# Patient Record
Sex: Male | Born: 1940 | Race: White | Hispanic: No | Smoking: Current every day smoker
Health system: Southern US, Community
[De-identification: ages and names within clinical notes are randomized; demographics above are authoritative.]

## PROBLEM LIST (undated history)

## (undated) DIAGNOSIS — Z87442 Personal history of urinary calculi: Secondary | ICD-10-CM

## (undated) DIAGNOSIS — T7840XA Allergy, unspecified, initial encounter: Secondary | ICD-10-CM

## (undated) DIAGNOSIS — J45909 Unspecified asthma, uncomplicated: Secondary | ICD-10-CM

## (undated) DIAGNOSIS — I350 Nonrheumatic aortic (valve) stenosis: Secondary | ICD-10-CM

## (undated) DIAGNOSIS — G709 Myoneural disorder, unspecified: Secondary | ICD-10-CM

## (undated) DIAGNOSIS — C801 Malignant (primary) neoplasm, unspecified: Secondary | ICD-10-CM

## (undated) DIAGNOSIS — I10 Essential (primary) hypertension: Secondary | ICD-10-CM

## (undated) DIAGNOSIS — N029 Recurrent and persistent hematuria with unspecified morphologic changes: Secondary | ICD-10-CM

## (undated) DIAGNOSIS — R001 Bradycardia, unspecified: Secondary | ICD-10-CM

## (undated) DIAGNOSIS — R972 Elevated prostate specific antigen [PSA]: Secondary | ICD-10-CM

## (undated) HISTORY — DX: Malignant (primary) neoplasm, unspecified: C80.1

## (undated) HISTORY — PX: CHOLECYSTECTOMY: SHX55

## (undated) HISTORY — PX: INSERTION PROSTATE RADIATION SEED: SUR718

## (undated) HISTORY — DX: Elevated prostate specific antigen (PSA): R97.20

## (undated) HISTORY — DX: Recurrent and persistent hematuria with unspecified morphologic changes: N02.9

## (undated) HISTORY — DX: Allergy, unspecified, initial encounter: T78.40XA

## (undated) HISTORY — PX: SHOULDER SURGERY: SHX246

## (undated) HISTORY — PX: APPENDECTOMY: SHX54

## (undated) HISTORY — DX: Myoneural disorder, unspecified: G70.9

## (undated) HISTORY — PX: PROSTATE BIOPSY: SHX241

---

## 1999-03-18 ENCOUNTER — Ambulatory Visit (HOSPITAL_COMMUNITY): Admission: RE | Admit: 1999-03-18 | Discharge: 1999-03-18 | Payer: Self-pay | Admitting: Internal Medicine

## 1999-05-23 ENCOUNTER — Emergency Department (HOSPITAL_COMMUNITY): Admission: EM | Admit: 1999-05-23 | Discharge: 1999-05-23 | Payer: Self-pay | Admitting: *Deleted

## 2000-03-06 ENCOUNTER — Encounter: Payer: Self-pay | Admitting: Emergency Medicine

## 2000-03-06 ENCOUNTER — Emergency Department (HOSPITAL_COMMUNITY): Admission: EM | Admit: 2000-03-06 | Discharge: 2000-03-06 | Payer: Self-pay | Admitting: Emergency Medicine

## 2000-03-07 ENCOUNTER — Ambulatory Visit (HOSPITAL_COMMUNITY): Admission: RE | Admit: 2000-03-07 | Discharge: 2000-03-08 | Payer: Self-pay

## 2001-04-26 ENCOUNTER — Other Ambulatory Visit: Admission: RE | Admit: 2001-04-26 | Discharge: 2001-04-26 | Payer: Self-pay | Admitting: Urology

## 2003-03-28 ENCOUNTER — Ambulatory Visit (HOSPITAL_COMMUNITY): Admission: RE | Admit: 2003-03-28 | Discharge: 2003-03-28 | Payer: Self-pay | Admitting: Gastroenterology

## 2006-12-11 ENCOUNTER — Encounter: Admission: RE | Admit: 2006-12-11 | Discharge: 2006-12-11 | Payer: Self-pay | Admitting: General Surgery

## 2012-01-29 ENCOUNTER — Ambulatory Visit (INDEPENDENT_AMBULATORY_CARE_PROVIDER_SITE_OTHER): Payer: Medicare Other | Admitting: Emergency Medicine

## 2012-01-29 ENCOUNTER — Ambulatory Visit: Payer: Medicare Other

## 2012-01-29 VITALS — BP 128/68 | HR 69 | Temp 97.9°F | Resp 16 | Ht 69.0 in | Wt 161.0 lb

## 2012-01-29 DIAGNOSIS — S20219A Contusion of unspecified front wall of thorax, initial encounter: Secondary | ICD-10-CM

## 2012-01-29 DIAGNOSIS — S301XXA Contusion of abdominal wall, initial encounter: Secondary | ICD-10-CM

## 2012-01-29 LAB — POCT CBC
Granulocyte percent: 55.8 %G (ref 37–80)
HCT, POC: 42.7 % — AB (ref 43.5–53.7)
Hemoglobin: 14 g/dL — AB (ref 14.1–18.1)
Lymph, poc: 3.5 — AB (ref 0.6–3.4)
MCH, POC: 29.2 pg (ref 27–31.2)
MCHC: 32.8 g/dL (ref 31.8–35.4)
MCV: 89 fL (ref 80–97)
MID (cbc): 0.7 (ref 0–0.9)
MPV: 7.4 fL (ref 0–99.8)
POC Granulocyte: 5.3 (ref 2–6.9)
POC LYMPH PERCENT: 37.1 %L (ref 10–50)
POC MID %: 7.1 %M (ref 0–12)
Platelet Count, POC: 249 10*3/uL (ref 142–424)
RBC: 4.8 M/uL (ref 4.69–6.13)
RDW, POC: 14.2 %
WBC: 9.5 10*3/uL (ref 4.6–10.2)

## 2012-01-29 NOTE — Patient Instructions (Signed)
Chest Contusion You have been checked for injuries to your chest. Your caregiver has not found injuries serious enough to require hospitalization. It is common to have bruises and sore muscles after an injury. These tend to feel worse the first 24 hours. You may gradually develop more stiffness and soreness over the next several hours to several days. This usually feels worse the first morning following your injury. After a few days, you will usually begin to improve. The amount of improvement depends on the amount of damage. Following the accident, if the pain in any area continues to increase or you develop new areas of pain, you should see your primary caregiver or return to the Emergency Department for re-evaluation. HOME CARE INSTRUCTIONS   Put ice on sore areas every 2 hours for 20 minutes while awake for the next 2 days.   Drink extra fluids. Do not drink alcohol.   Activity as tolerated. Lifting may make pain worse.   Only take over-the-counter or prescription medicines for pain, discomfort, or fever as directed by your caregiver. Do not use aspirin. This may increase bruising or increase bleeding.  SEEK IMMEDIATE MEDICAL CARE IF:   There is a worsening of any of the problems that brought you in for care.   Shortness of breath, dizziness or fainting develop.   You have chest pain, difficulty breathing, or develop pain going down the left arm or up into jaw.   You feel sick to your stomach (nausea), vomiting or sweats.   You have increasing belly (abdominal) discomfort.   There is blood in your urine, stool, or if you vomit blood.   There is pain in either shoulder in an area where a shoulder strap would be.   You have feelings of lightheadedness, or if you should have a fainting episode.   You have numbness, tingling, weakness, or problems with the use of your arms or legs.   Severe headaches not relieved with medications develop.   You have a change in bowel or bladder  control.   There is increasing pain in any areas of the body.  If you feel your symptoms are worsening, and you are not able to see your primary caregiver, return to the Emergency Department immediately. MAKE SURE YOU:   Understand these instructions.   Will watch your condition.   Will get help right away if you are not doing well or get worse.  Document Released: 08/02/2001 Document Revised: 10/27/2011 Document Reviewed: 06/25/2008 Exit[employer] Patient Information 2012 [employer]. 

## 2012-01-29 NOTE — Progress Notes (Signed)
  Subjective:    Patient ID: JA OHMAN, male    DOB: [birth year], 71 y.o.   MRN: [id number]  HPI patient was outside running until her when the patellar became stuck on some hard ground. He subsequently had the handle of the tiller striking him in the middle of the chest wall. He now complains of severe pain over the sternum lower chest upper abdomen. He denies being short of breath. He has noted some swelling and discoloration to the skin overlying his lower sternum.    Review of Systems noncontributory as related to the present illness the     Objective:   Physical Exam  Constitutional: He appears well-developed and well-nourished.  HENT:  Head: Normocephalic.  Neck: No JVD present. No tracheal deviation present. No thyromegaly present.  Cardiovascular: Normal rate, normal heart sounds and intact distal pulses.  Exam reveals no gallop and no friction rub.   No murmur heard. Lymphadenopathy:    He has no cervical adenopathy.   There is an area of ecchymoses overlying the xiphoid process. There is also swelling in this area with marked tenderness more on the left than on the right where the cartilage tissue attaches to the lower portion of the sternum  EKG reveals right bundle branch block with one PAC noted. Compared to previous film there is no change [facility] reading (PRIMARY) by  Dr.Josephyne Tarter views of the sternum and chest did not reveal an acute fracture. There is no evidence of pneumothorax on those films. Pulse ox check was 96    Assessment & Plan:  Assessment is chest wall contusion. Exam was said that there is no signs of gross injury in the chest wall. Check films to be sure everything is okay .

## 2012-11-25 ENCOUNTER — Emergency Department (HOSPITAL_COMMUNITY)
Admission: EM | Admit: 2012-11-25 | Discharge: 2012-11-25 | Disposition: A | Discharge status: Home / Self Care | Payer: Medicare Other | Attending: Emergency Medicine | Admitting: Emergency Medicine

## 2012-11-25 ENCOUNTER — Encounter (HOSPITAL_COMMUNITY): Payer: Self-pay

## 2012-11-25 DIAGNOSIS — M549 Dorsalgia, unspecified: Secondary | ICD-10-CM

## 2012-11-25 DIAGNOSIS — M545 Low back pain, unspecified: Secondary | ICD-10-CM | POA: Insufficient documentation

## 2012-11-25 DIAGNOSIS — F17200 Nicotine dependence, unspecified, uncomplicated: Secondary | ICD-10-CM | POA: Insufficient documentation

## 2012-11-25 MED ORDER — HYDROCODONE-ACETAMINOPHEN 5-325 MG PO TABS: 1.0000 | ORAL_TABLET | Freq: Four times a day (QID) | ORAL | Status: DC | PRN

## 2012-11-25 MED ORDER — HYDROCODONE-ACETAMINOPHEN 5-325 MG PO TABS
1.0000 | ORAL_TABLET | Freq: Once | ORAL | Status: AC
  Administered 2012-11-25: 1 via ORAL
  Filled 2012-11-25: qty 1

## 2012-11-25 MED ORDER — DIAZEPAM 5 MG PO TABS
5.0000 mg | ORAL_TABLET | Freq: Once | ORAL | Status: AC
  Administered 2012-11-25: 5 mg via ORAL
  Filled 2012-11-25: qty 1

## 2012-11-25 MED ORDER — DIAZEPAM 5 MG PO TABS: 5.0000 mg | ORAL_TABLET | Freq: Three times a day (TID) | ORAL | Status: DC | PRN

## 2012-11-25 NOTE — ED Provider Notes (Signed)
History     CSN: [id number]  Arrival date & time 11/25/12  1451   First MD Initiated Contact with Patient 11/25/12 1542      Chief Complaint  Patient presents with  . Back Pain    (Consider location/radiation/quality/duration/timing/severity/associated sxs/prior treatment) Patient is a 72 y.o. male presenting with back pain. The history is provided by the patient.  Back Pain  This is a new problem. The current episode started yesterday. The problem occurs constantly. The problem has been gradually worsening. Pertinent negatives include no fever, no numbness, no abdominal pain and no dysuria. 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 Pain is worse with movement, better with rest. No abdominal pain, N, V. No urinary symptoms, numbness or lower extremity weakness. .    No past medical history on file.  Past Surgical History  Procedure Date  . Appendectomy   . Insertion prostate radiation seed   . Shoulder surgery   . Cholecystectomy     No family history on file.  History  Substance Use Topics  . Smoking status: Current Every Day Smoker    Types: Pipe  . Smokeless tobacco: Never Used  . Alcohol Use: No      Review of Systems  Constitutional: Negative for fever and chills.  Gastrointestinal: Negative.  Negative for nausea, vomiting and abdominal pain.  Genitourinary: Negative for dysuria and flank pain.  Musculoskeletal: Positive for back pain.       See HPI  Neurological: Negative.  Negative for numbness.    Allergies  Review of patient's allergies indicates no known allergies.  Home Medications   Current Outpatient Rx  Name  Route  Sig  Dispense  Refill  . IBUPROFEN 200 MG PO TABS   Oral   Take 200 mg by mouth every 6 (six) hours as needed. Pain           BP 118/72  Pulse 60  Temp 97.8 F (36.6 C) (Oral)  Resp 16  Ht 5\' 10"  (1.778 m)  Wt 167 lb (75.751 kg)  BMI 23.96 kg/m2  SpO2  95%  Physical Exam  Constitutional: He is oriented to person, place, and time. He appears well-developed and well-nourished. No distress.  Neck: Normal range of motion.  Cardiovascular: Regular rhythm.   No murmur heard. Pulmonary/Chest: Effort normal. He has no wheezes. He has no rales.  Abdominal: Soft. Bowel sounds are normal. There is no tenderness.       Abdomen is completely non-tender. No pulsatile mass.   Musculoskeletal: Normal range of motion. He exhibits no edema.       Paralumbar tenderness that is mild, worse on the left. No palpable spasm. No CVA tenderness.   Neurological: He is alert and oriented to person, place, and time.  Skin: Skin is warm and dry.  Psychiatric: He has a normal mood and affect.    ED Course  Procedures (including critical care time)  Labs Reviewed - No data to display No results found.   No diagnosis found.  1. Muscular back pain  MDM  Patient medicated and reports improvement in pain. Ambulatory, steady, subjective improvement in ability to walk.         Arnoldo Hooker, PA-C 11/25/12 1741

## 2012-11-25 NOTE — ED Notes (Signed)
[id number]<AV> Expected date:<BR> Expected time:<BR> Means of arrival:<BR> Comments:<BR> closed

## 2012-11-25 NOTE — ED Notes (Signed)
FAMILY WOULD LIKE TO BE NOTIFIED UPON DISCHARGE OR ADMISSION, PLEASE CALL PEYTON HUDSON @ [phone]

## 2012-11-25 NOTE — ED Provider Notes (Signed)
Medical screening examination/treatment/procedure(s) were performed by non-physician practitioner and as supervising physician I was immediately available for consultation/collaboration.   Dominic Butler. Lexus Barletta, MD 11/25/12 1745

## 2012-11-25 NOTE — ED Notes (Signed)
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 Patient is unable to sit.

## 2013-07-05 ENCOUNTER — Ambulatory Visit (INDEPENDENT_AMBULATORY_CARE_PROVIDER_SITE_OTHER): Payer: Medicare Other | Admitting: Family Medicine

## 2013-07-05 ENCOUNTER — Ambulatory Visit (HOSPITAL_COMMUNITY)
Admission: RE | Admit: 2013-07-05 | Discharge: 2013-07-05 | Disposition: A | Discharge status: Home / Self Care | Payer: Medicare Other | Source: Ambulatory Visit | Attending: Family Medicine | Admitting: Family Medicine

## 2013-07-05 ENCOUNTER — Telehealth: Payer: Self-pay | Admitting: Family Medicine

## 2013-07-05 VITALS — BP 122/62 | HR 57 | Temp 97.6°F | Resp 18

## 2013-07-05 DIAGNOSIS — R6889 Other general symptoms and signs: Secondary | ICD-10-CM

## 2013-07-05 DIAGNOSIS — R899 Unspecified abnormal finding in specimens from other organs, systems and tissues: Secondary | ICD-10-CM

## 2013-07-05 DIAGNOSIS — R200 Anesthesia of skin: Secondary | ICD-10-CM

## 2013-07-05 DIAGNOSIS — I6789 Other cerebrovascular disease: Secondary | ICD-10-CM | POA: Insufficient documentation

## 2013-07-05 DIAGNOSIS — R7989 Other specified abnormal findings of blood chemistry: Secondary | ICD-10-CM

## 2013-07-05 DIAGNOSIS — J3489 Other specified disorders of nose and nasal sinuses: Secondary | ICD-10-CM | POA: Insufficient documentation

## 2013-07-05 DIAGNOSIS — R209 Unspecified disturbances of skin sensation: Secondary | ICD-10-CM

## 2013-07-05 DIAGNOSIS — R202 Paresthesia of skin: Secondary | ICD-10-CM

## 2013-07-05 DIAGNOSIS — R799 Abnormal finding of blood chemistry, unspecified: Secondary | ICD-10-CM

## 2013-07-05 LAB — POCT CBC
Granulocyte percent: 44.4 % (ref 37–80)
HCT, POC: 46.3 % (ref 43.5–53.7)
Hemoglobin: 15 g/dL (ref 14.1–18.1)
Lymph, poc: 4.2 — AB (ref 0.6–3.4)
MCH, POC: 29.9 pg (ref 27–31.2)
MCHC: 32.4 g/dL (ref 31.8–35.4)
MCV: 92.4 fL (ref 80–97)
MID (cbc): 0.6 (ref 0–0.9)
MPV: 7.3 fL (ref 0–99.8)
POC Granulocyte: 3.9 (ref 2–6.9)
POC LYMPH PERCENT: 48.4 % (ref 10–50)
POC MID %: 7.2 %M (ref 0–12)
Platelet Count, POC: 263 10*3/uL (ref 142–424)
RBC: 5.01 M/uL (ref 4.69–6.13)
RDW, POC: 14.3 %
WBC: 8.7 10*3/uL (ref 4.6–10.2)

## 2013-07-05 LAB — COMPREHENSIVE METABOLIC PANEL
Alkaline Phosphatase: 59 U/L (ref 39–117)
CO2: 28 mEq/L (ref 19–32)
Creat: 1.08 mg/dL (ref 0.50–1.35)
Glucose, Bld: 97 mg/dL (ref 70–99)
Total Bilirubin: 0.4 mg/dL (ref 0.3–1.2)

## 2013-07-05 LAB — LIPID PANEL
Cholesterol: 157 mg/dL (ref 0–200)
HDL: 54 mg/dL (ref >39)
LDL Cholesterol: 88 mg/dL (ref 0–99)
Total CHOL/HDL Ratio: 2.9 Ratio
Triglycerides: 77 mg/dL (ref <150)
VLDL: 15 mg/dL (ref 0–40)

## 2013-07-05 LAB — COMPREHENSIVE METABOLIC PANEL WITH GFR
ALT: 8 U/L (ref 0–53)
AST: 12 U/L (ref 0–37)
Albumin: 4.1 g/dL (ref 3.5–5.2)
BUN: 16 mg/dL (ref 6–23)
Calcium: 9 mg/dL (ref 8.4–10.5)
Chloride: 105 meq/L (ref 96–112)
Potassium: 4.2 meq/L (ref 3.5–5.3)
Sodium: 139 meq/L (ref 135–145)
Total Protein: 5.9 g/dL — ABNORMAL LOW (ref 6.0–8.3)

## 2013-07-05 LAB — POCT GLYCOSYLATED HEMOGLOBIN (HGB A1C): Hemoglobin A1C: 5.1

## 2013-07-05 NOTE — Progress Notes (Signed)
[facility]:  Office Visit  Chief Complaint:  Chief Complaint  Patient presents with  . Numbness    left leg and arm    HPI: Dominic Butler is a 72 y.o. male who complains of  Left hand numbness this AM, after started after flicking his hand, now he has numbness in his arm and also in his left leg. He has some numbness on the left side of his face x seconds and now gone. He denies weakness or slurred speech.  Denies HTN, DM, XOL but not sure Does not take ASA  He is a smoker, smokes pipe x 60 years He denies any neck problems He denies any heart disease for him Family history of heart disease MI/Stroke No neck problems.  He has had left shoulder surgery but he has not had any problems with   No past medical history on file. Past Surgical History  Procedure Laterality Date  . Appendectomy    . Insertion prostate radiation seed    . Shoulder surgery    . Cholecystectomy     History   Social History  . Marital Status: Divorced    Spouse Name: [name]    Number of Children: [name]  . Years of Education: [name]   Social History Main Topics  . Smoking status: Current Every Day Smoker    Types: Pipe  . Smokeless tobacco: Never Used  . Alcohol Use: No  . Drug Use: No  . Sexual Activity: None   Other Topics Concern  . None   Social History Narrative  . None   No family history on file. No Known Allergies Prior to Admission medications   Medication Sig Start Date End Date Taking? Authorizing Provider  diazepam (VALIUM) 5 MG tablet Take 1 tablet (5 mg total) by mouth every 8 (eight) hours as needed for anxiety. 11/25/12   Shari A Upstill, PA-C  HYDROcodone-acetaminophen (NORCO/VICODIN) 5-325 MG per tablet Take 1 tablet by mouth every 6 (six) hours as needed for pain. 11/25/12   Shari A Upstill, PA-C  ibuprofen (ADVIL,MOTRIN) 200 MG tablet Take 200 mg by mouth every 6 (six) hours as needed. Pain    Historical Provider, MD     ROS: The patient denies fevers,  chills, night sweats, unintentional weight loss, chest pain, palpitations, wheezing, dyspnea on exertion, nausea, vomiting, abdominal pain, dysuria, hematuria, melena, weakness.  All other systems have been reviewed and were otherwise negative with the exception of those mentioned in the HPI and as above.    PHYSICAL EXAM: Filed Vitals:   07/05/13 0814  BP: 122/62  Pulse: 57  Temp: 97.6 F (36.4 C)  Resp: 18   There were no vitals filed for this visit. There is no weight on file to calculate BMI.  General: Alert, no acute distress HEENT:  Normocephalic, atraumatic, oropharynx patent. EOMI, PERRLA, fundoscopic exam nl Cardiovascular:  Regular rate and rhythm, no rubs murmurs or gallops.  No Carotid bruits, radial pulse intact. No pedal edema. e Respiratory: Clear to auscultation bilaterally.  No wheezes, rales, or rhonchi.  No cyanosis, no use of accessory musculature GI: No organomegaly, abdomen is soft and non-tender, positive bowel sounds.  No masses. Skin: No rashes. Neurologic: Facial musculature symmetric. CN 2-12 grossly nl, however has some numbness on left sided ulanar distrubution of 5th finger Psychiatric: Patient is appropriate throughout our interaction. Lymphatic: No cervical lymphadenopathy Musculoskeletal: Gait intact. Neck-normal exam, neg spulring Left shoulder-nl Romberg neg  LABS: Results for orders  placed in visit on 07/05/13  POCT CBC      Result Value Range   WBC 8.7  4.6 - 10.2 K/uL   Lymph, poc 4.2 (*) 0.6 - 3.4   POC LYMPH PERCENT 48.4  10 - 50 %L   MID (cbc) 0.6  0 - 0.9   POC MID % 7.2  0 - 12 %M   POC Granulocyte 3.9  2 - 6.9   Granulocyte percent 44.4  37 - 80 %G   RBC 5.01  4.69 - 6.13 M/uL   Hemoglobin 15.0  14.1 - 18.1 g/dL   HCT, POC 16.1  09.6 - 53.7 %   MCV 92.4  80 - 97 fL   MCH, POC 29.9  27 - 31.2 pg   MCHC 32.4  31.8 - 35.4 g/dL   RDW, POC 04.5     Platelet Count, POC 263  142 - 424 K/uL   MPV 7.3  0 - 99.8 fL  POCT GLYCOSYLATED  HEMOGLOBIN (HGB A1C)      Result Value Range   Hemoglobin A1C 5.1       EKG/XRAY:   Primary read interpreted by Dr. Conley Rolls at [facility]. EKG unchanged from 2013, shows RBB  Without ST elevation/depression   ASSESSMENT/PLAN: Encounter Diagnoses  Name Primary?  . Left arm numbness Yes  . Paresthesia of left arm and leg   . Abnormal laboratory test   . Abnormal blood chemistry      Will get stat head CT without contrast Rule CVA He has low risk factors but he has total left side numbness in hand, arm, leg.  Lipid pending No DM, CBC nl Gross sideeffects, risk and benefits, and alternatives of medications d/w patient. Patient is aware that all medications have potential sideeffects and we are unable to predict every sideeffect or drug-drug interaction that may occur. Go to ER prn  Nikie Cid PHUONG, DO 07/05/2013 9:18 AM

## 2013-07-05 NOTE — Telephone Encounter (Signed)
LM about normal CT results

## 2014-01-29 DIAGNOSIS — Z13820 Encounter for screening for osteoporosis: Secondary | ICD-10-CM | POA: Diagnosis not present

## 2014-04-24 ENCOUNTER — Ambulatory Visit
Admission: RE | Admit: 2014-04-24 | Discharge: 2014-04-24 | Disposition: A | Discharge status: Home / Self Care | Payer: Medicare Other | Source: Ambulatory Visit | Attending: Internal Medicine | Admitting: Internal Medicine

## 2014-04-24 ENCOUNTER — Other Ambulatory Visit: Payer: Self-pay | Admitting: Internal Medicine

## 2014-04-24 DIAGNOSIS — R1032 Left lower quadrant pain: Secondary | ICD-10-CM | POA: Diagnosis not present

## 2014-04-24 DIAGNOSIS — H9110 Presbycusis, unspecified ear: Secondary | ICD-10-CM | POA: Diagnosis not present

## 2014-04-24 DIAGNOSIS — R002 Palpitations: Secondary | ICD-10-CM | POA: Diagnosis not present

## 2014-04-24 DIAGNOSIS — Z0000 Encounter for general adult medical examination without abnormal findings: Secondary | ICD-10-CM | POA: Diagnosis not present

## 2014-04-24 DIAGNOSIS — E785 Hyperlipidemia, unspecified: Secondary | ICD-10-CM | POA: Diagnosis not present

## 2014-04-24 DIAGNOSIS — R05 Cough: Secondary | ICD-10-CM

## 2014-04-24 DIAGNOSIS — Z125 Encounter for screening for malignant neoplasm of prostate: Secondary | ICD-10-CM | POA: Diagnosis not present

## 2014-04-24 DIAGNOSIS — R5381 Other malaise: Secondary | ICD-10-CM | POA: Diagnosis not present

## 2014-04-24 DIAGNOSIS — R059 Cough, unspecified: Secondary | ICD-10-CM

## 2014-04-24 DIAGNOSIS — I70219 Atherosclerosis of native arteries of extremities with intermittent claudication, unspecified extremity: Secondary | ICD-10-CM | POA: Diagnosis not present

## 2014-04-24 DIAGNOSIS — E7800 Pure hypercholesterolemia, unspecified: Secondary | ICD-10-CM | POA: Diagnosis not present

## 2014-04-24 DIAGNOSIS — Z1212 Encounter for screening for malignant neoplasm of rectum: Secondary | ICD-10-CM | POA: Diagnosis not present

## 2014-04-24 DIAGNOSIS — R5383 Other fatigue: Secondary | ICD-10-CM | POA: Diagnosis not present

## 2014-04-24 DIAGNOSIS — Z1339 Encounter for screening examination for other mental health and behavioral disorders: Secondary | ICD-10-CM | POA: Diagnosis not present

## 2014-04-24 DIAGNOSIS — H8309 Labyrinthitis, unspecified ear: Secondary | ICD-10-CM | POA: Diagnosis not present

## 2014-04-24 DIAGNOSIS — Z87891 Personal history of nicotine dependence: Secondary | ICD-10-CM | POA: Diagnosis not present

## 2014-04-24 DIAGNOSIS — Z1331 Encounter for screening for depression: Secondary | ICD-10-CM | POA: Diagnosis not present

## 2014-04-24 DIAGNOSIS — Z79899 Other long term (current) drug therapy: Secondary | ICD-10-CM | POA: Diagnosis not present

## 2014-04-24 DIAGNOSIS — Z131 Encounter for screening for diabetes mellitus: Secondary | ICD-10-CM | POA: Diagnosis not present

## 2014-04-24 DIAGNOSIS — Z23 Encounter for immunization: Secondary | ICD-10-CM | POA: Diagnosis not present

## 2014-11-12 DIAGNOSIS — H2513 Age-related nuclear cataract, bilateral: Secondary | ICD-10-CM | POA: Diagnosis not present

## 2014-12-23 ENCOUNTER — Ambulatory Visit (INDEPENDENT_AMBULATORY_CARE_PROVIDER_SITE_OTHER): Payer: Medicare Other | Admitting: Sports Medicine

## 2014-12-23 ENCOUNTER — Encounter: Payer: Self-pay | Admitting: Sports Medicine

## 2014-12-23 VITALS — BP 124/70 | HR 60 | Temp 98.4°F | Ht 70.0 in | Wt 160.0 lb

## 2014-12-23 DIAGNOSIS — R05 Cough: Secondary | ICD-10-CM

## 2014-12-23 DIAGNOSIS — J029 Acute pharyngitis, unspecified: Secondary | ICD-10-CM

## 2014-12-23 DIAGNOSIS — R059 Cough, unspecified: Secondary | ICD-10-CM

## 2014-12-23 MED ORDER — GUAIFENESIN ER 1200 MG PO TB12: 1.0000 | ORAL_TABLET | Freq: Two times a day (BID) | ORAL | Status: DC | PRN

## 2014-12-23 MED ORDER — AZITHROMYCIN 250 MG PO TABS: ORAL_TABLET | ORAL | Status: DC

## 2014-12-23 MED ORDER — FLUTICASONE PROPIONATE 50 MCG/ACT NA SUSP: NASAL | Status: DC

## 2014-12-23 NOTE — Patient Instructions (Signed)
Upper Respiratory Infection, Adult An upper respiratory infection (URI) is also sometimes known as the common cold. The upper respiratory tract includes the nose, sinuses, throat, trachea, and bronchi. Bronchi are the airways leading to the lungs. Most people improve within 1 week, but symptoms can last up to 2 weeks. A residual cough may last even longer.  CAUSES Many different viruses can infect the tissues lining the upper respiratory tract. The tissues become irritated and inflamed and often become very moist. Mucus production is also common. A cold is contagious. You can easily spread the virus to others by oral contact. This includes kissing, sharing a glass, coughing, or sneezing. Touching your mouth or nose and then touching a surface, which is then touched by another person, can also spread the virus. SYMPTOMS  Symptoms typically develop 1 to 3 days after you come in contact with a cold virus. Symptoms vary from person to person. They may include:  Runny nose.  Sneezing.  Nasal congestion.  Sinus irritation.  Sore throat.  Loss of voice (laryngitis).  Cough.  Fatigue.  Muscle aches.  Loss of appetite.  Headache.  Low-grade fever. DIAGNOSIS  You might diagnose your own cold based on familiar symptoms, since most people get a cold 2 to 3 times a year. Your caregiver can confirm this based on your exam. Most importantly, your caregiver can check that your symptoms are not due to another disease such as strep throat, sinusitis, pneumonia, asthma, or epiglottitis. Blood tests, throat tests, and X-rays are not necessary to diagnose a common cold, but they may sometimes be helpful in excluding other more serious diseases. Your caregiver will decide if any further tests are required. RISKS AND COMPLICATIONS  You may be at risk for a more severe case of the common cold if you smoke cigarettes, have chronic heart disease (such as heart failure) or lung disease (such as asthma), or if  you have a weakened immune system. The very young and very old are also at risk for more serious infections. Bacterial sinusitis, middle ear infections, and bacterial pneumonia can complicate the common cold. The common cold can worsen asthma and chronic obstructive pulmonary disease (COPD). Sometimes, these complications can require emergency medical care and may be life-threatening. PREVENTION  The best way to protect against getting a cold is to practice good hygiene. Avoid oral or hand contact with people with cold symptoms. Wash your hands often if contact occurs. There is no clear evidence that vitamin C, vitamin E, echinacea, or exercise reduces the chance of developing a cold. However, it is always recommended to get plenty of rest and practice good nutrition. TREATMENT  Treatment is directed at relieving symptoms. There is no cure. Antibiotics are not effective, because the infection is caused by a virus, not by bacteria. Treatment may include:  Increased fluid intake. Sports drinks offer valuable electrolytes, sugars, and fluids.  Breathing heated mist or steam (vaporizer or shower).  Eating chicken soup or other clear broths, and maintaining good nutrition.  Getting plenty of rest.  Using gargles or lozenges for comfort.  Controlling fevers with ibuprofen or acetaminophen as directed by your caregiver.  Increasing usage of your inhaler if you have asthma. Zinc gel and zinc lozenges, taken in the first 24 hours of the common cold, can shorten the duration and lessen the severity of symptoms. Pain medicines may help with fever, muscle aches, and throat pain. A variety of non-prescription medicines are available to treat congestion and runny nose. Your caregiver   can make recommendations and may suggest nasal or lung inhalers for other symptoms.  HOME CARE INSTRUCTIONS   Only take over-the-counter or prescription medicines for pain, discomfort, or fever as directed by your  caregiver.  Use a warm mist humidifier or inhale steam from a shower to increase air moisture. This may keep secretions moist and make it easier to breathe.  Drink enough water and fluids to keep your urine clear or pale yellow.  Rest as needed.  Return to work when your temperature has returned to normal or as your caregiver advises. You may need to stay home longer to avoid infecting others. You can also use a face mask and careful hand washing to prevent spread of the virus. SEEK MEDICAL CARE IF:   After the first few days, you feel you are getting worse rather than better.  You need your caregiver's advice about medicines to control symptoms.  You develop chills, worsening shortness of breath, or brown or red sputum. These may be signs of pneumonia.  You develop yellow or brown nasal discharge or pain in the face, especially when you bend forward. These may be signs of sinusitis.  You develop a fever, swollen neck glands, pain with swallowing, or white areas in the back of your throat. These may be signs of strep throat. SEEK IMMEDIATE MEDICAL CARE IF:   You have a fever.  You develop severe or persistent headache, ear pain, sinus pain, or chest pain.  You develop wheezing, a prolonged cough, cough up blood, or have a change in your usual mucus (if you have chronic lung disease).  You develop sore muscles or a stiff neck. Document Released: 05/03/2001 Document Revised: 01/30/2012 Document Reviewed: 02/12/2014 Exit[employer] Patient Information 2015 [employer]. This information is not intended to replace advice given to you by your health care provider. Make sure you discuss any questions you have with your health care provider.  

## 2014-12-23 NOTE — Progress Notes (Signed)
   Subjective:    Patient ID: JAVARIAN JAKUBIAK, male    DOB: 1941-07-09, 74 y.o.   MRN: [id number]  HPI Mr. Dewalt is a 74 year-old male who presents with sore throat, cough, and sinus congestion. Onset was 3 days ago, characterized by a mild sore throat with cough. He tried OTC throat lozenges with some relief. He also tried Nyquil, which helps. He says he says the cough is productive of yellow sputum. No hemoptysis. He has rhinitis of clear secretions. He denies any fevers or chills. He is tolerating PO liquids. Denies any significant body aches, fatigue, shortness of breath, wheezing, chest tightness, dizziness, headache, or pain. He is currently retired, as he was formerly a Engineer, structural. His daughter, who works in healthcare in [place], hosted him last week, but he thinks his symptoms had already started. His daughter was ill with similar URI symptoms. He denies any rash. He otherwise denies any international travel.  Social Hx: He is a smoker, smokes pipe x 60 years Family history of heart disease MI/Stroke. PMHx: No HTN or CAD or DM.  Review of Systems 7 point review of systems was performed and was otherwise negative unless noted in the history of present illness.     Objective:   Physical Exam BP 124/70 mmHg  Pulse 60  Temp(Src) 98.4 F (36.9 C) (Oral)  Ht 5\' 10"  (1.778 m)  Wt 160 lb (72.576 kg)  BMI 22.96 kg/m2  SpO2 93% General appearance: alert, cooperative and appears stated age Head: Normocephalic, without obvious abnormality, atraumatic Eyes: conjunctivae/corneas clear. PERRL, EOM's intact. Fundi benign. Ears: normal TM's and external ear canals both ears Nose: mild congestion, turbinates red, swollen Throat: post nasal drip and pharyngeal erythema, no tonsillar exudates, uvula midline Neck: mild anterior cervical adenopathy, no carotid bruit, no JVD, supple, symmetrical, trachea midline and thyroid not enlarged, symmetric, no tenderness/mass/nodules Lungs:  clear to auscultation bilaterally Heart: regular rate and rhythm, S1, S2 normal, no murmur, click, rub or gallop Skin: Skin color, texture, turgor normal. No rashes or lesions Lymph nodes: mild tender, mobile, right sided anterior cervical LAD    Assessment & Plan:  1. Cough 2. Pharyngitis, likely acute URI  -Multiple diagnoses were considered in care of this patient. Symptoms most consistent with acute URI at this point. -Supportive Cares discussed, including warm humidified air, po hydration, warm fluids -Rx flonase nasal inh, mucinex -Rx given for Z-pak to fill only if sx persist beyond 7 days. If acutely worsening despite this, then follow-up sooner. -If noticing increasing cough, fever >102, shortness of breath, wheezing, lethargy, or for any other concerns, then return to the clinic or go to the emergency department. Patient verbalized understanding and agreement.  Dr. Linnell Fulling, DO Sports Medicine Fellow

## 2014-12-24 NOTE — Progress Notes (Signed)
I personally reviewed this document , and agree with A/P . Dr Marin Comment

## 2015-02-20 ENCOUNTER — Ambulatory Visit (INDEPENDENT_AMBULATORY_CARE_PROVIDER_SITE_OTHER): Payer: Medicare Other | Admitting: Family Medicine

## 2015-02-20 ENCOUNTER — Telehealth: Payer: Self-pay | Admitting: *Deleted

## 2015-02-20 ENCOUNTER — Encounter: Payer: Self-pay | Admitting: Family Medicine

## 2015-02-20 VITALS — BP 120/80 | HR 58 | Temp 97.7°F | Resp 16 | Ht 68.5 in | Wt 154.2 lb

## 2015-02-20 DIAGNOSIS — R319 Hematuria, unspecified: Secondary | ICD-10-CM

## 2015-02-20 DIAGNOSIS — Z13220 Encounter for screening for lipoid disorders: Secondary | ICD-10-CM

## 2015-02-20 DIAGNOSIS — F17200 Nicotine dependence, unspecified, uncomplicated: Secondary | ICD-10-CM

## 2015-02-20 DIAGNOSIS — R200 Anesthesia of skin: Secondary | ICD-10-CM | POA: Diagnosis not present

## 2015-02-20 DIAGNOSIS — Z2821 Immunization not carried out because of patient refusal: Secondary | ICD-10-CM | POA: Diagnosis not present

## 2015-02-20 DIAGNOSIS — Z136 Encounter for screening for cardiovascular disorders: Secondary | ICD-10-CM

## 2015-02-20 DIAGNOSIS — Z125 Encounter for screening for malignant neoplasm of prostate: Secondary | ICD-10-CM

## 2015-02-20 DIAGNOSIS — Z0000 Encounter for general adult medical examination without abnormal findings: Secondary | ICD-10-CM | POA: Diagnosis not present

## 2015-02-20 DIAGNOSIS — Z130 Encounter for screening for diseases of the blood and blood-forming organs and certain disorders involving the immune mechanism: Secondary | ICD-10-CM

## 2015-02-20 DIAGNOSIS — I4510 Unspecified right bundle-branch block: Secondary | ICD-10-CM | POA: Diagnosis not present

## 2015-02-20 DIAGNOSIS — R209 Unspecified disturbances of skin sensation: Secondary | ICD-10-CM | POA: Diagnosis not present

## 2015-02-20 DIAGNOSIS — R972 Elevated prostate specific antigen [PSA]: Secondary | ICD-10-CM

## 2015-02-20 DIAGNOSIS — Z720 Tobacco use: Secondary | ICD-10-CM | POA: Diagnosis not present

## 2015-02-20 DIAGNOSIS — Z1211 Encounter for screening for malignant neoplasm of colon: Secondary | ICD-10-CM | POA: Diagnosis not present

## 2015-02-20 DIAGNOSIS — R202 Paresthesia of skin: Secondary | ICD-10-CM

## 2015-02-20 DIAGNOSIS — Z23 Encounter for immunization: Secondary | ICD-10-CM

## 2015-02-20 LAB — LIPID PANEL
Cholesterol: 156 mg/dL (ref 0–200)
HDL: 81 mg/dL (ref >=40)
LDL Cholesterol: 66 mg/dL (ref 0–99)
Total CHOL/HDL Ratio: 1.9 Ratio
Triglycerides: 45 mg/dL (ref <150)
VLDL: 9 mg/dL (ref 0–40)

## 2015-02-20 LAB — COMPLETE METABOLIC PANEL WITHOUT GFR
Alkaline Phosphatase: 57 U/L (ref 39–117)
BUN: 20 mg/dL (ref 6–23)
Creat: 0.99 mg/dL (ref 0.50–1.35)
GFR, Est Non African American: 76 mL/min
Glucose, Bld: 92 mg/dL (ref 70–99)
Total Bilirubin: 0.6 mg/dL (ref 0.2–1.2)
Total Protein: 6.4 g/dL (ref 6.0–8.3)

## 2015-02-20 LAB — POCT URINALYSIS DIPSTICK
Bilirubin, UA: NEGATIVE
Glucose, UA: NEGATIVE
Leukocytes, UA: NEGATIVE
Nitrite, UA: NEGATIVE
Protein, UA: NEGATIVE
Spec Grav, UA: 1.015
Urobilinogen, UA: 0.2
pH, UA: 5

## 2015-02-20 LAB — POCT UA - MICROSCOPIC ONLY
Casts, Ur, LPF, POC: NEGATIVE
Crystals, Ur, HPF, POC: NEGATIVE
Mucus, UA: POSITIVE
Yeast, UA: NEGATIVE

## 2015-02-20 LAB — COMPLETE METABOLIC PANEL WITH GFR
ALT: 13 U/L (ref 0–53)
AST: 19 U/L (ref 0–37)
Albumin: 3.9 g/dL (ref 3.5–5.2)
CO2: 25 mEq/L (ref 19–32)
Calcium: 8.9 mg/dL (ref 8.4–10.5)
Chloride: 104 mEq/L (ref 96–112)
GFR, Est African American: 88 mL/min
Potassium: 4.3 mEq/L (ref 3.5–5.3)
Sodium: 140 mEq/L (ref 135–145)

## 2015-02-20 LAB — CBC
HCT: 41.9 % (ref 39.0–52.0)
Hemoglobin: 14.3 g/dL (ref 13.0–17.0)
MCH: 29.4 pg (ref 26.0–34.0)
MCHC: 34.1 g/dL (ref 30.0–36.0)
MCV: 86.2 fL (ref 78.0–100.0)
MPV: 8.7 fL (ref 8.6–12.4)
Platelets: 261 10*3/uL (ref 150–400)
RBC: 4.86 MIL/uL (ref 4.22–5.81)
RDW: 13.6 % (ref 11.5–15.5)
WBC: 7.6 10*3/uL (ref 4.0–10.5)

## 2015-02-20 LAB — TSH: TSH: 1.976 u[IU]/mL (ref 0.350–4.500)

## 2015-02-20 MED ORDER — ZOSTER VACCINE LIVE 19400 UNT/0.65ML ~~LOC~~ SOLR: 0.6500 mL | Freq: Once | SUBCUTANEOUS | Status: DC

## 2015-02-20 NOTE — Telephone Encounter (Signed)
Refaxed sign authorization to medical records for additional records (consultations/labs), per Dr Marin Comment. Confirmation page received at 12:45 pm.

## 2015-02-20 NOTE — Telephone Encounter (Signed)
Faxed signed authorization form to medical records to get right bundle block results, per Dr Marin Comment. Confirmation page received at 10:06 am.

## 2015-02-20 NOTE — Progress Notes (Signed)
Chief Complaint:  Chief Complaint  Patient presents with  . Annual Exam    HPI: Dominic Butler is a 74 y.o. male who is here for  An annual visit He is up-to-date on his eye exam. Denies glaucoma or cataracts. He is not up-to-date on his colonoscopy, he had one 5-6 years ago and had benign polyps. 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 He was seeing Dominic Butler at [facility] urology at that time. He also has history of chronic hematuria, he again has seen urology for this and has had a full workup and they cannot find the etiology of his hematuria. Rarely benign. This is according to the patient. He smokes a pipe, he keeps it in his mouth as a pacifier, takes about 2-3 puffs and then just keeps it in his mouth. He does not want to quit. He would like vaccines pending recommendations, but is not sure if he's had his pneumonia vaccines already. He wants to make sure if his tetanus is up to date after we check records from Dominic Butler office before getting any vaccines. He knows that he has never had this shingles vaccine and would like that. Denies any depression or anxiety, is completely functional without any memory or cognitive issues. He does his own ADLs. He has something of a advanced directive. He thinks Dominic Butler who is his close friend is his POA  Former PCP: Dominic Butler Urology: [facility]  Past Medical History  Diagnosis Date  . Neuromuscular disorder   . Allergy   . Elevated prostate specific antigen (PSA)     has had seed implants and also seen by [facility]  . 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 Cancer     Prostate cancer status post radiation  seeds   Past Surgical History  Procedure Laterality Date  . Appendectomy    . Insertion prostate radiation seed    . Shoulder surgery    . Cholecystectomy     History   Social History  . Marital Status: Divorced    Spouse Name: [name]  . Number of Children: [name]  . Years of Education: [name]   Social History Main Topics  . Smoking status: Current Every Day Smoker    Types: Pipe  . Smokeless tobacco: Never Used  . Alcohol Use: No  . Drug Use: No  . Sexual Activity: Not on file   Other Topics Concern  . None   Social History Narrative   No family history on file. No Known Allergies Prior to Admission medications   Medication Sig Start Date End Date Taking? Authorizing Provider  azithromycin (ZITHROMAX) 250 MG tablet Take 2 tabs PO x 1 dose, then 1 tab PO QD x 4 days 12/23/14   Dominic C Pick-Jacobs, DO  diazepam (VALIUM) 5 MG tablet Take 1 tablet (5 mg total) by mouth every 8 (eight) hours as needed for anxiety. Patient not taking: Reported on 12/23/2014 11/25/12   Dominic Lange, PA-C  fluticasone [facility]) 50 MCG/ACT nasal spray 2 sprays each nostril once daily until symptoms resolve 12/23/14   Dominic Catchings,  DO  Guaifenesin (MUCINEX MAXIMUM STRENGTH) 1200 MG TB12 Take 1 tablet (1,200 mg total) by mouth every 12 (twelve) hours as needed. 12/23/14   Dominic C Pick-Jacobs, DO  HYDROcodone-acetaminophen (NORCO/VICODIN) 5-325 MG per tablet Take 1 tablet by mouth every 6 (six) hours as needed for pain. Patient not taking: Reported on 12/23/2014 11/25/12   Dominic Lange, PA-C  ibuprofen (ADVIL,MOTRIN) 200 MG tablet Take 200 mg by mouth every 6 (six) hours as needed. Pain    Historical Provider, MD     ROS: The patient denies fevers, chills, night sweats, unintentional weight loss, chest pain, palpitations, wheezing, dyspnea on exertion, nausea, vomiting, abdominal pain, dysuria, hematuria, melena, numbness, weakness, or tingling.   All other systems have been reviewed and were otherwise negative  with the exception of those mentioned in the HPI and as above.    PHYSICAL EXAM: Filed Vitals:   02/20/15 0909  BP: 120/80  Pulse: 58  Temp: 97.7 F (36.5 C)  Resp: 16   Filed Vitals:   02/20/15 0909  Height: 5' 8.5" (1.74 m)  Weight: 154 lb 3.2 oz (69.945 kg)   Body mass index is 23.1 kg/(m^2).  General: Alert, no acute distress HEENT:  Normocephalic, atraumatic, oropharynx patent. EOMI, PERRLA Cardiovascular:  Regular rate and rhythm, no rubs murmurs or gallops.  No Carotid bruits, radial pulse intact. No pedal edema.  Respiratory: Clear to auscultation bilaterally.  No wheezes, rales, or rhonchi.  No cyanosis, no use of accessory musculature GI: No organomegaly, abdomen is soft and non-tender, positive bowel sounds.  No masses. Skin: No rashes. Neurologic: Facial musculature symmetric. Psychiatric: Patient is appropriate throughout our interaction. Lymphatic: No cervical lymphadenopathy Musculoskeletal: Gait intact. Gu- no masses, neg inguinal hernia, prostate normal Circumcised, small left testicle + external hemorrhoids  LABS: Results for orders placed or performed in visit on 02/20/15  POCT UA - Microscopic Only  Result Value Ref Range   WBC, Ur, HPF, POC 0-1    RBC, urine, microscopic 15-21    Bacteria, U Microscopic 2+    Mucus, UA pos    Epithelial cells, urine per micros 0-2    Crystals, Ur, HPF, POC neg    Casts, Ur, LPF, POC neg    Yeast, UA neg   POCT urinalysis dipstick  Result Value Ref Range   Color, UA yellow    Clarity, UA clear    Glucose, UA neg    Bilirubin, UA neg    Ketones, UA trace    Spec Grav, UA 1.015    Blood, UA mod    pH, UA 5.0    Protein, UA neg    Urobilinogen, UA 0.2    Nitrite, UA neg    Leukocytes, UA Negative      EKG/XRAY:   Primary read interpreted by Dr. Marin Comment at [facility]   ASSESSMENT/PLAN: Encounter Diagnoses  Name Primary?  . Annual physical exam Yes  . Screening for hyperlipidemia   . Special screening for  malignant neoplasms, colon   . Screening for deficiency anemia   . Screening for prostate cancer   . Elevated PSA   . Numbness and tingling in left upper extremity   . Right bundle branch block   . Hematuria   . Influenza vaccination declined   . Pneumococcal vaccination declined   . Tobacco use disorder   . Screening for cardiovascular condition   . Need for shingles vaccine    74 year male with PMH of prostate cancer s/p rxt seeding, chronic  benign hematuria, sinus bradycardia with RBBB who is here for an annual visit.  He declines any vaccines except for shingles until we review records.  Labs pending EKG shows no acute changes. He will schedule his own colonoscopy Shingles vaccine rx given We will wait for records from Dr. Mancel Bale office to determine if he needs any other vaccinations at this time. Follow-up in one year.   Gross sideeffects, risk and benefits, and alternatives of medications d/w patient. Patient is aware that all medications have potential sideeffects and we are unable to predict every sideeffect or drug-drug interaction that may occur.  LE, [place], DO 02/20/2015 1:37 PM

## 2015-02-21 ENCOUNTER — Encounter: Payer: Self-pay | Admitting: Family Medicine

## 2015-02-21 LAB — PSA: PSA: 0.05 ng/mL (ref <=4.00)

## 2015-06-24 ENCOUNTER — Ambulatory Visit (INDEPENDENT_AMBULATORY_CARE_PROVIDER_SITE_OTHER): Payer: Medicare Other | Admitting: Family Medicine

## 2015-06-24 VITALS — BP 120/70 | HR 60 | Temp 97.6°F | Resp 14 | Ht 69.25 in | Wt 159.6 lb

## 2015-06-24 DIAGNOSIS — W57XXXA Bitten or stung by nonvenomous insect and other nonvenomous arthropods, initial encounter: Secondary | ICD-10-CM

## 2015-06-24 DIAGNOSIS — R21 Rash and other nonspecific skin eruption: Secondary | ICD-10-CM

## 2015-06-24 DIAGNOSIS — T148 Other injury of unspecified body region: Secondary | ICD-10-CM

## 2015-06-24 MED ORDER — DOXYCYCLINE HYCLATE 100 MG PO TABS
100.0000 mg | ORAL_TABLET | Freq: Two times a day (BID) | ORAL | Status: DC
Start: 2015-06-24 — End: 2016-01-30

## 2015-06-24 NOTE — Progress Notes (Signed)
 Chief Complaint:  Chief Complaint  Patient presents with  . Tick Bite    Pt removed tick from his bottom area last Thursday. now has red dots on skin all over, mostly chest  . Sunburn    Top of scalp    HPI: Dominic Butler is a 74 y.o. male who reports to [facility] today complaining of tick bite on the left side of his buttock x 7 days. He got it out with the help of a friend. Not sure if he got the head out.  He has had no fevers or chills but has ahd some rashes that have popped up and are itchy. He is always outside working in wooded areas.   He also wants to know if the area on top of his head is something he should worry about. It itches and he keeps scratching it. He has tried otc hydrocortisone.   Past Medical History  Diagnosis Date  . Neuromuscular disorder   . Allergy   . Elevated prostate specific antigen (PSA)     has had seed implants and also seen by [facility]  . 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 Cancer     Prostate cancer status post radiation seeds   Past Surgical History  Procedure Laterality Date  . Appendectomy    . Insertion prostate radiation seed    . Shoulder surgery    . Cholecystectomy     History   Social History  . Marital Status: Divorced    Spouse Name: [name]  . Number of Children: [name]  . Years of Education: [name]   Social History Main Topics  . Smoking status: Current Every Day Smoker    Types: Pipe  . Smokeless tobacco: Never Used  . Alcohol Use: No  . Drug Use: No  . Sexual Activity: Not on file   Other Topics Concern  . None   Social History Narrative   History reviewed. No pertinent family history. No Known Allergies Prior to Admission medications   Medication Sig Start Date End Date Taking? Authorizing Provider  fluticasone (FLONASE) 50 MCG/ACT nasal spray 2 sprays each nostril once daily until symptoms resolve Patient not taking:  Reported on 06/24/2015 12/23/14   John C Pick-Jacobs, DO  ibuprofen (ADVIL,MOTRIN) 200 MG tablet Take 200 mg by mouth every 6 (six) hours as needed. Pain    Historical Provider, MD  zoster vaccine live, PF, (ZOSTAVAX) 56314 UNT/0.65ML injection Inject 19,400 Units into the skin once. Patient not taking: Reported on 06/24/2015 02/20/15    P , DO     ROS: The patient denies fevers, chills, night sweats, unintentional weight loss, chest pain, palpitations, wheezing, dyspnea on exertion, nausea, vomiting, abdominal pain, dysuria, hematuria, melena, numbness, weakness, or tingling.   All other systems have been reviewed and were otherwise negative with the exception of those mentioned in the HPI and as above.    PHYSICAL EXAM: Filed Vitals:   06/24/15 1856  BP: 120/70  Pulse: 60  Temp: 97.6 F (36.4 C)  Resp: 14   Body mass index is 23.4 kg/(m^2).   General: Alert, no acute distress HEENT:  Normocephalic, atraumatic, oropharynx patent. EOMI, PERRLA Cardiovascular:  Regular rate and rhythm, no rubs murmurs or gallops.  No Carotid bruits, radial pulse intact. No pedal edema.  Respiratory: Clear to auscultation bilaterally.  No wheezes, rales, or rhonchi.  No cyanosis, no use of accessory musculature Abdominal: No organomegaly, abdomen is soft and non-tender, positive bowel sounds. No masses. Skin: + left small abscess and some minimal folliculits, + erythematous but no target lesions, no fluctuance Neurologic: Facial musculature symmetric. Psychiatric: Patient acts appropriately throughout our interaction. Lymphatic: No cervical or submandibular lymphadenopathy Musculoskeletal: Gait intact. No edema, tenderness   LABS: Results for orders placed or performed in visit on 02/20/15  COMPLETE METABOLIC PANEL WITH GFR  Result Value Ref Range   Sodium 140 135 - 145 mEq/L   Potassium 4.3 3.5 - 5.3 mEq/L   Chloride 104 96 - 112 mEq/L   CO2 25 19 - 32 mEq/L   Glucose, Bld 92 70 - 99 mg/dL    BUN 20 6 - 23 mg/dL   Creat 0.99 0.50 - 1.35 mg/dL   Total Bilirubin 0.6 0.2 - 1.2 mg/dL   Alkaline Phosphatase 57 39 - 117 U/L   AST 19 0 - 37 U/L   ALT 13 0 - 53 U/L   Total Protein 6.4 6.0 - 8.3 g/dL   Albumin 3.9 3.5 - 5.2 g/dL   Calcium 8.9 8.4 - 10.5 mg/dL   GFR, Est African American 88 mL/min   GFR, Est Non African American 76 mL/min  CBC  Result Value Ref Range   WBC 7.6 4.0 - 10.5 K/uL   RBC 4.86 4.22 - 5.81 MIL/uL   Hemoglobin 14.3 13.0 - 17.0 g/dL   HCT 41.9 39.0 - 52.0 %   MCV 86.2 78.0 - 100.0 fL   MCH 29.4 26.0 - 34.0 pg   MCHC 34.1 30.0 - 36.0 g/dL   RDW 13.6 11.5 - 15.5 %   Platelets 261 150 - 400 K/uL   MPV 8.7 8.6 - 12.4 fL  TSH  Result Value Ref Range   TSH 1.976 0.350 - 4.500 uIU/mL  PSA  Result Value Ref Range   PSA 0.05 <=4.00 ng/mL  Lipid panel  Result Value Ref Range   Cholesterol 156 0 - 200 mg/dL   Triglycerides 45 <150 mg/dL   HDL 81 >=40 mg/dL   Total CHOL/HDL Ratio 1.9 Ratio   VLDL 9 0 - 40 mg/dL   LDL Cholesterol 66 0 - 99 mg/dL  POCT UA - Microscopic Only  Result Value Ref Range   WBC, Ur, HPF, POC 0-1    RBC, urine, microscopic 15-21    Bacteria, U Microscopic 2+    Mucus, UA pos    Epithelial cells, urine per micros 0-2    Crystals, Ur, HPF, POC neg    Casts, Ur, LPF, POC neg    Yeast, UA neg   POCT urinalysis dipstick  Result Value Ref Range   Color, UA yellow    Clarity, UA clear    Glucose, UA neg    Bilirubin, UA neg    Ketones, UA trace    Spec Grav, UA 1.015    Blood, UA mod    pH, UA 5.0    Protein, UA neg    Urobilinogen, UA 0.2    Nitrite, UA neg    Leukocytes, UA Negative      EKG/XRAY:   Primary read interpreted by Dr. Marin Comment at [facility].   ASSESSMENT/PLAN: Encounter Diagnoses  Name Primary?  . Tick bite Yes  . Rash and nonspecific skin eruption    Monitor scab on head, if no improvement then need to refer to derm for possible biospy, I have advise him to wear a hat and  also to use vasoline on it and otc  hydrocortisone, I cannot tell if is is [facility] or an [place] Rx Doxycycline for tick bite, tick was removed after 72 hours he thinks, the head is intact on the body Fu prn   Gross sideeffects, risk and benefits, and alternatives of medications d/w patient. Patient is aware that all medications have potential sideeffects and we are unable to predict every sideeffect or drug-drug interaction that may occur.    DO  06/25/2015 7:55 AM

## 2015-06-24 NOTE — Patient Instructions (Signed)
Tick Bite Information Ticks are insects that attach themselves to the skin and draw blood for food. There are various types of ticks. Common types include wood ticks and deer ticks. Most ticks live in shrubs and grassy areas. Ticks can climb onto your body when you make contact with leaves or grass where the tick is waiting. The most common places on the body for ticks to attach themselves are the scalp, neck, armpits, waist, and groin. Most tick bites are harmless, but sometimes ticks carry germs that cause diseases. These germs can be spread to a person during the tick's feeding process. The chance of a disease spreading through a tick bite depends on:   The type of tick.  Time of year.   How long the tick is attached.   Geographic location.  HOW CAN YOU PREVENT TICK BITES? Take these steps to help prevent tick bites when you are outdoors:  Wear protective clothing. Long sleeves and long pants are best.   Wear white clothes so you can see ticks more easily.  Tuck your pant legs into your socks.   If walking on a trail, stay in the middle of the trail to avoid brushing against bushes.  Avoid walking through areas with long grass.  Put insect repellent on all exposed skin and along boot tops, pant legs, and sleeve cuffs.   Check clothing, hair, and skin repeatedly and before going inside.   Brush off any ticks that are not attached.  Take a shower or bath as soon as possible after being outdoors.  WHAT IS THE PROPER WAY TO REMOVE A TICK? Ticks should be removed as soon as possible to help prevent diseases caused by tick bites. 1. If latex gloves are available, put them on before trying to remove a tick.  2. Using fine-point tweezers, grasp the tick as close to the skin as possible. You may also use curved forceps or a tick removal tool. Grasp the tick as close to its head as possible. Avoid grasping the tick on its body. 3. Pull gently with steady upward pressure until  the tick lets go. Do not twist the tick or jerk it suddenly. This may break off the tick's head or mouth parts. 4. Do not squeeze or crush the tick's body. This could force disease-carrying fluids from the tick into your body.  5. After the tick is removed, wash the bite area and your hands with soap and water or other disinfectant such as alcohol. 6. Apply a small amount of antiseptic cream or ointment to the bite site.  7. Wash and disinfect any instruments that were used.  Do not try to remove a tick by applying a hot match, petroleum jelly, or fingernail polish to the tick. These methods do not work and may increase the chances of disease being spread from the tick bite.  WHEN SHOULD YOU SEEK MEDICAL CARE? Contact your health care provider if you are unable to remove a tick from your skin or if a part of the tick breaks off and is stuck in the skin.  After a tick bite, you need to be aware of signs and symptoms that could be related to diseases spread by ticks. Contact your health care provider if you develop any of the following in the days or weeks after the tick bite:  Unexplained fever.  Rash. A circular rash that appears days or weeks after the tick bite may indicate the possibility of Lyme disease. The rash may resemble   a target with a bull's-eye and may occur at a different part of your body than the tick bite.  Redness and swelling in the area of the tick bite.   Tender, swollen lymph glands.   Diarrhea.   Weight loss.   Cough.   Fatigue.   Muscle, joint, or bone pain.   Abdominal pain.   Headache.   Lethargy or a change in your level of consciousness.  Difficulty walking or moving your legs.   Numbness in the legs.   Paralysis.  Shortness of breath.   Confusion.   Repeated vomiting.  Document Released: 11/04/2000 Document Revised: 08/28/2013 Document Reviewed: 04/17/2013 Exit[employer] Patient Information 2015 [employer]. This information is  not intended to replace advice given to you by your health care provider. Make sure you discuss any questions you have with your health care provider.  

## 2015-12-06 ENCOUNTER — Ambulatory Visit (INDEPENDENT_AMBULATORY_CARE_PROVIDER_SITE_OTHER): Payer: Medicare Other | Admitting: Emergency Medicine

## 2015-12-06 VITALS — BP 138/80 | HR 62 | Temp 97.6°F | Resp 20 | Ht 69.0 in | Wt 165.4 lb

## 2015-12-06 DIAGNOSIS — L309 Dermatitis, unspecified: Secondary | ICD-10-CM

## 2015-12-06 MED ORDER — TRIAMCINOLONE ACETONIDE 0.1 % EX CREA: 1.0000 "application " | TOPICAL_CREAM | Freq: Three times a day (TID) | CUTANEOUS | Status: DC

## 2015-12-06 NOTE — Patient Instructions (Signed)
Eczema Eczema, also called atopic dermatitis, is a skin disorder that causes inflammation of the skin. It causes a red rash and dry, scaly skin. The skin becomes very itchy. Eczema is generally worse during the cooler winter months and often improves with the warmth of summer. Eczema usually starts showing signs in infancy. Some children outgrow eczema, but it may last through adulthood.  CAUSES  The exact cause of eczema is not known, but it appears to run in families. People with eczema often have a family history of eczema, allergies, asthma, or hay fever. Eczema is not contagious. Flare-ups of the condition may be caused by:   Contact with something you are sensitive or allergic to.   Stress. SIGNS AND SYMPTOMS  Dry, scaly skin.   Red, itchy rash.   Itchiness. This may occur before the skin rash and may be very intense.  DIAGNOSIS  The diagnosis of eczema is usually made based on symptoms and medical history. TREATMENT  Eczema cannot be cured, but symptoms usually can be controlled with treatment and other strategies. A treatment plan might include:  Controlling the itching and scratching.   Use over-the-counter antihistamines as directed for itching. This is especially useful at night when the itching tends to be worse.   Use over-the-counter steroid creams as directed for itching.   Avoid scratching. Scratching makes the rash and itching worse. It may also result in a skin infection (impetigo) due to a break in the skin caused by scratching.   Keeping the skin well moisturized with creams every day. This will seal in moisture and help prevent dryness. Lotions that contain alcohol and water should be avoided because they can dry the skin.   Limiting exposure to things that you are sensitive or allergic to (allergens).   Recognizing situations that cause stress.   Developing a plan to manage stress.  HOME CARE INSTRUCTIONS   Only take over-the-counter or  prescription medicines as directed by your health care provider.   Do not use anything on the skin without checking with your health care provider.   Keep baths or showers short (5 minutes) in warm (not hot) water. Use mild cleansers for bathing. These should be unscented. You may add nonperfumed bath oil to the bath water. It is best to avoid soap and bubble bath.   Immediately after a bath or shower, when the skin is still damp, apply a moisturizing ointment to the entire body. This ointment should be a petroleum ointment. This will seal in moisture and help prevent dryness. The thicker the ointment, the better. These should be unscented.   Keep fingernails cut short. Children with eczema may need to wear soft gloves or mittens at night after applying an ointment.   Dress in clothes made of cotton or cotton blends. Dress lightly, because heat increases itching.   A child with eczema should stay away from anyone with fever blisters or cold sores. The virus that causes fever blisters (herpes simplex) can cause a serious skin infection in children with eczema. SEEK MEDICAL CARE IF:   Your itching interferes with sleep.   Your rash gets worse or is not better within 1 week after starting treatment.   You see pus or soft yellow scabs in the rash area.   You have a fever.   You have a rash flare-up after contact with someone who has fever blisters.    This information is not intended to replace advice given to you by your health care   provider. Make sure you discuss any questions you have with your health care provider.   Document Released: 11/04/2000 Document Revised: 08/28/2013 Document Reviewed: 06/10/2013 Elsevier Interactive Patient Education 2016 [employer]  

## 2015-12-06 NOTE — Progress Notes (Signed)
Subjective:  Patient ID: Dominic Butler, male    DOB: [birth year]  Age: 75 y.o. MRN: [id number]  CC: Rash   HPI Dominic Butler presents  she's noted a rash on the back of his neck in his hairline over the last 2 weeks. He's been scratching it. Has been scratching his worsened. He denies any new allergy exposure other cutaneous rash. Says the rash is painless was never vesicular. He has no fever chills or antecedent injury.  History Dominic Butler has a past medical history of Neuromuscular disorder ([place]); Allergy; Elevated prostate specific antigen (PSA); Benign hematuria; and Cancer ([place]).   He has past surgical history that includes Appendectomy; Insertion prostate radiation seed; Shoulder surgery; and Cholecystectomy.   His  family history is not on file.  He   reports that he has been smoking Pipe.  He has never used smokeless tobacco. He reports that he does not drink alcohol or use illicit drugs.  Outpatient Prescriptions Prior to Visit  Medication Sig Dispense Refill  . doxycycline (VIBRA-TABS) 100 MG tablet Take 1 tablet (100 mg total) by mouth 2 (two) times daily. (Patient not taking: Reported on 12/06/2015) 28 tablet 0  . ibuprofen (ADVIL,MOTRIN) 200 MG tablet Take 200 mg by mouth every 6 (six) hours as needed. Reported on 12/06/2015     No facility-administered medications prior to visit.    Social History   Social History  . Marital Status: Divorced    Spouse Name: [name]  . Number of Children: [name]  . Years of Education: [name]   Social History Main Topics  . Smoking status: Current Every Day Smoker    Types: Pipe  . Smokeless tobacco: Never Used  . Alcohol Use: No  . Drug Use: No  . Sexual Activity: Not Asked   Other Topics Concern  . None   Social History Narrative     Review of Systems  Constitutional: Negative for fever, chills and appetite change.  HENT: Negative for congestion, ear pain, postnasal drip, sinus pressure and sore throat.   Eyes: Negative  for pain and redness.  Respiratory: Negative for cough, shortness of breath and wheezing.   Cardiovascular: Negative for leg swelling.  Gastrointestinal: Negative for nausea, vomiting, abdominal pain, diarrhea, constipation and blood in stool.  Endocrine: Negative for polyuria.  Genitourinary: Negative for dysuria, urgency, frequency and flank pain.  Musculoskeletal: Negative for gait problem.  Skin: Negative for rash.  Neurological: Negative for weakness and headaches.  Psychiatric/Behavioral: Negative for confusion and decreased concentration. The patient is not nervous/anxious.     Objective:  BP 138/80 mmHg  Pulse 62  Temp(Src) 97.6 F (36.4 C) (Oral)  Resp 20  Ht 5\' 9"  (1.753 m)  Wt 165 lb 6.4 oz (75.025 kg)  BMI 24.41 kg/m2  SpO2 97%  Physical Exam  Constitutional: He is oriented to person, place, and time. He appears well-developed and well-nourished. No distress.  HENT:  Head: Normocephalic and atraumatic.  Right Ear: External ear normal.  Left Ear: External ear normal.  Nose: Nose normal.  Eyes: Conjunctivae and EOM are normal. Pupils are equal, round, and reactive to light. No scleral icterus.  Neck: Normal range of motion. Neck supple. No tracheal deviation present.  Cardiovascular: Normal rate, regular rhythm and normal heart sounds.   Pulmonary/Chest: Effort normal. No respiratory distress. He has no wheezes. He has no rales.  Abdominal: He exhibits no mass. There is no tenderness. There is no rebound and no guarding.  Musculoskeletal: He exhibits no  edema.  Lymphadenopathy:    He has no cervical adenopathy.  Neurological: He is alert and oriented to person, place, and time. Coordination normal.  Skin: Skin is warm and dry. Rash noted.  Psychiatric: He has a normal mood and affect. His behavior is normal. Thought content normal.      Assessment & Plan:   Dominic Butler was seen today for rash.  Diagnoses and all orders for this visit:  Eczema  Other orders -      triamcinolone cream (KENALOG) 0.1 %; Apply 1 application topically 3 (three) times daily.  I am having Dominic Butler start on triamcinolone cream. I am also having him maintain his ibuprofen and doxycycline.  Meds ordered this encounter  Medications  . triamcinolone cream (KENALOG) 0.1 %    Sig: Apply 1 application topically 3 (three) times daily.    Dispense:  30 g    Refill:  0    Appropriate red flag conditions were discussed with the patient as well as actions that should be taken.  Patient expressed his understanding.  Follow-up: Return if symptoms worsen or fail to improve.  Roselee Culver, MD

## 2016-01-30 ENCOUNTER — Ambulatory Visit (INDEPENDENT_AMBULATORY_CARE_PROVIDER_SITE_OTHER): Payer: Medicare Other | Admitting: Family Medicine

## 2016-01-30 VITALS — BP 124/80 | HR 69 | Temp 98.9°F | Resp 17 | Ht 69.0 in | Wt 162.0 lb

## 2016-01-30 DIAGNOSIS — J209 Acute bronchitis, unspecified: Secondary | ICD-10-CM | POA: Diagnosis not present

## 2016-01-30 DIAGNOSIS — R05 Cough: Secondary | ICD-10-CM | POA: Diagnosis not present

## 2016-01-30 DIAGNOSIS — R0981 Nasal congestion: Secondary | ICD-10-CM

## 2016-01-30 LAB — POCT INFLUENZA A/B
Influenza A, POC: NEGATIVE
Influenza B, POC: NEGATIVE

## 2016-01-30 MED ORDER — E-Z SPACER DEVI
Status: DC
Start: 2016-01-30 — End: 2016-03-10

## 2016-01-30 MED ORDER — GUAIFENESIN-CODEINE 100-10 MG/5ML PO SOLN: 5.0000 mL | Freq: Four times a day (QID) | ORAL | Status: DC | PRN

## 2016-01-30 MED ORDER — ALBUTEROL SULFATE (2.5 MG/3ML) 0.083% IN NEBU
2.5000 mg | INHALATION_SOLUTION | Freq: Once | RESPIRATORY_TRACT | Status: AC
  Administered 2016-01-30: 2.5 mg via RESPIRATORY_TRACT

## 2016-01-30 MED ORDER — IPRATROPIUM BROMIDE 0.02 % IN SOLN
0.5000 mg | Freq: Once | RESPIRATORY_TRACT | Status: AC
  Administered 2016-01-30: 0.5 mg via RESPIRATORY_TRACT

## 2016-01-30 MED ORDER — AZITHROMYCIN 250 MG PO TABS: ORAL_TABLET | ORAL | Status: DC

## 2016-01-30 MED ORDER — ALBUTEROL SULFATE HFA 108 (90 BASE) MCG/ACT IN AERS: 2.0000 | INHALATION_SPRAY | RESPIRATORY_TRACT | Status: DC | PRN

## 2016-01-30 NOTE — Patient Instructions (Signed)

## 2016-01-30 NOTE — Progress Notes (Signed)
Subjective:    Patient ID: Dominic Butler, male    DOB: 1941-04-13, 75 y.o.   MRN: [id number]  HPI Chief Complaint  Patient presents with  . Shortness of Breath  . Generalized Body Aches  . Nasal Congestion    HPI Comments: Dominic Butler is a 75 y.o. male who presents to the [facility] complaining of body aches that began 2 days ago. He has associated productive cough with white phlegm, chest pressure, rhinorrhea and nasal congestion. He has tried Copywriter, advertising severe cold and nyquil with minimal relief. Pt denies fever, chills, sweats, sore throat, decreased appetite and trouble sleeping. Pt smokes tobacco through a pipe. He did not get flu shot this year. He denies drug allergies.  There are no active problems to display for this patient.  Past Medical History  Diagnosis Date  . Neuromuscular disorder ([place])   . Allergy   . Elevated prostate specific antigen (PSA)     has had seed implants and also seen by [facility]  . 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 Cancer [facility])     Prostate cancer status post radiation seeds   Past Surgical History  Procedure Laterality Date  . Appendectomy    . Insertion prostate radiation seed    . Shoulder surgery    . Cholecystectomy     No Known Allergies Prior to Admission medications   Not on File   Social History   Social History  . Marital Status: Divorced    Spouse Name: [name]  . Number of Children: [name]  . Years of Education: [name]   Occupational History  . Not on file.   Social History Main Topics  . Smoking status: Current Every Day Smoker    Types: Pipe  . Smokeless tobacco: Never Used  . Alcohol Use: No  . Drug Use: No  . Sexual Activity: Not on file   Other Topics Concern  . Not on file   Social History Narrative   Review of Systems  Constitutional: Negative for fever, chills, diaphoresis and appetite  change.  HENT: Positive for congestion and rhinorrhea. Negative for sore throat.   Respiratory: Positive for cough, chest tightness and shortness of breath.   Allergic/Immunologic: Positive for environmental allergies.  Psychiatric/Behavioral: Negative for sleep disturbance.       Objective:   Physical Exam  Constitutional: He is oriented to person, place, and time. He appears well-developed and well-nourished. No distress.  HENT:  Head: Normocephalic and atraumatic.  Right Ear: Tympanic membrane is injected and retracted.  Left Ear: Tympanic membrane is injected and retracted.  Mouth/Throat: Posterior oropharyngeal erythema present.  Nares with erythema  Eyes: Conjunctivae and EOM are normal.  Neck: Neck supple.  Cardiovascular: Normal rate.  Exam reveals distant heart sounds.   Question of a diastolic murmur  Pulmonary/Chest: Effort normal. Decreased breath sounds:  upper lung.  Decreased air movement in upper lungs.   Musculoskeletal: Normal range of motion.  Neurological: He is alert and oriented to person, place, and time.  Skin: Skin is warm and dry.  Psychiatric: He has a normal mood and affect. His behavior is normal.  Nursing note and vitals reviewed.  After neb treatment, pulmonary exam signicantly improved. Good air movement.   Filed Vitals:   01/30/16 1517  BP: 124/80  Pulse: 69  Temp: 98.9 F (37.2 C)  TempSrc: Oral  Resp: 17  Height: 5\' 9"  (1.753 m)  Weight: 162 lb (73.483 kg)  SpO2: 95%    Results for orders placed or performed in visit on 01/30/16  POCT Influenza A/B  Result Value Ref Range   Influenza A, POC Negative Negative   Influenza B, POC Negative Negative    Assessment & Plan:   1. Acute bronchitis, unspecified organism     Orders Placed This Encounter  Procedures  . POCT Influenza A/B    Meds ordered this encounter  Medications  . albuterol (PROVENTIL) (2.5 MG/3ML) 0.083% nebulizer solution 2.5 mg    Sig:   . ipratropium  (ATROVENT) nebulizer solution 0.5 mg    Sig:   . albuterol (PROVENTIL HFA;VENTOLIN HFA) 108 (90 Base) MCG/ACT inhaler    Sig: Inhale 2 puffs into the lungs every 4 (four) hours as needed for wheezing or shortness of breath.    Dispense:  1 Inhaler    Refill:  1    Ok to use whatever albuterol inhaler that insurance covers. Please demonstrate use - new to pt.  Marland Kitchen Spacer/Aero-Holding Chambers (E-Z SPACER) inhaler    Sig: Use as instructed    Dispense:  1 each    Refill:  0    Please dispense any type of spacer that works with the inhaler being given to pt and demonstrate use.  Marland Kitchen guaiFENesin-codeine 100-10 MG/5ML syrup    Sig: Take 5-10 mLs by mouth every 6 (six) hours as needed for cough.    Dispense:  180 mL    Refill:  0  . azithromycin (ZITHROMAX) 250 MG tablet    Sig: Take 2 tabs PO x 1 dose, then 1 tab PO QD x 4 days    Dispense:  6 tablet    Refill:  0    I personally performed the services described in this documentation, which was scribed in my presence. The recorded information has been reviewed and considered, and addended by me as needed.  Delman Cheadle, MD MPH

## 2016-03-10 ENCOUNTER — Telehealth: Payer: Self-pay | Admitting: Family Medicine

## 2016-03-10 ENCOUNTER — Ambulatory Visit (HOSPITAL_COMMUNITY)
Admission: RE | Admit: 2016-03-10 | Discharge: 2016-03-10 | Disposition: A | Discharge status: Home / Self Care | Payer: Medicare Other | Source: Ambulatory Visit | Attending: Vascular Surgery | Admitting: Vascular Surgery

## 2016-03-10 ENCOUNTER — Ambulatory Visit (INDEPENDENT_AMBULATORY_CARE_PROVIDER_SITE_OTHER): Payer: Medicare Other | Admitting: Urgent Care

## 2016-03-10 ENCOUNTER — Telehealth: Payer: Self-pay

## 2016-03-10 VITALS — BP 118/74 | HR 51 | Temp 97.4°F | Resp 18 | Ht 69.0 in | Wt 166.0 lb

## 2016-03-10 DIAGNOSIS — M7989 Other specified soft tissue disorders: Secondary | ICD-10-CM

## 2016-03-10 DIAGNOSIS — Z8546 Personal history of malignant neoplasm of prostate: Secondary | ICD-10-CM | POA: Diagnosis not present

## 2016-03-10 DIAGNOSIS — R350 Frequency of micturition: Secondary | ICD-10-CM

## 2016-03-10 LAB — POCT CBC
Granulocyte percent: 41.4 %G (ref 37–80)
HEMATOCRIT: 41 % — AB (ref 43.5–53.7)
Hemoglobin: 14.9 g/dL (ref 14.1–18.1)
LYMPH, POC: 5.3 — AB (ref 0.6–3.4)
MCH, POC: 31.4 pg — AB (ref 27–31.2)
MCHC: 36.3 g/dL — AB (ref 31.8–35.4)
MCV: 86.5 fL (ref 80–97)
MID (cbc): 0.7 (ref 0–0.9)
MPV: 6.5 fL (ref 0–99.8)
PLATELET COUNT, POC: 233 10*3/uL (ref 142–424)
POC Granulocyte: 4.2 (ref 2–6.9)
POC LYMPH %: 51.6 % — AB (ref 10–50)
POC MID %: 7 %M (ref 0–12)
RBC: 4.74 M/uL (ref 4.69–6.13)
RDW, POC: 13.8 %
WBC: 10.2 10*3/uL (ref 4.6–10.2)

## 2016-03-10 MED ORDER — TAMSULOSIN HCL 0.4 MG PO CAPS: 0.4000 mg | ORAL_CAPSULE | Freq: Every day | ORAL | Status: DC

## 2016-03-10 NOTE — Telephone Encounter (Signed)
Pt was returning back call and also to be informed about his ultrasound results.  Please advise  [phone]

## 2016-03-10 NOTE — Telephone Encounter (Signed)
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 Rosario Adie notified, have them let patient go and he will call shortly.

## 2016-03-10 NOTE — Telephone Encounter (Signed)
Reported U/S results to patient. He is to use APAP for pain and inflammation. Use warm compresses. Ambulate within pain tolerance and rtc in 1-2 weeks if the problem persists.

## 2016-03-10 NOTE — Progress Notes (Signed)
MRN: [id number] DOB: 08-May-1941  Subjective:   Dominic Butler is a 75 y.o. male presenting for chief complaint of Insect Bite and Medication Refill  Leg issue - Reports 4 day history of tenderness and swelling behind his right knee, pain is worse with palpation. He believes he had a bug bite but cannot recall anything in particular. He has used otc cream with minimal relief. Patient is ambulating well. Denies fever, drainage of pus or bleeding, trauma, itching, rash, cough, congestion, n/v, abdominal pain.  Med Refill - Reports Flomax was prescribed several years ago by Dr. Mancel Bale but he never filled it. He had frequent urination s/p treatment of prostate cancer. He is currently in remission and at that time, his urologist recommended treatment with Flomax. He would like a trial for this.  Dominic Butler has a current medication list which includes the following prescription(s): albuterol, azithromycin, guaifenesin-codeine, and e-z spacer. Also has No Known Allergies.  Dominic Butler  has a past medical history of Neuromuscular disorder ([place]); Allergy; Elevated prostate specific antigen (PSA); Benign hematuria; and Cancer ([place]). Also  has past surgical history that includes Appendectomy; Insertion prostate radiation seed; Shoulder surgery; and Cholecystectomy.  Objective:   Vitals: BP 118/74 mmHg  Pulse 51  Temp(Src) 97.4 F (36.3 C) (Oral)  Resp 18  Ht 5\' 9"  (1.753 m)  Wt 166 lb (75.297 kg)  BMI 24.50 kg/m2  SpO2 98%  Physical Exam  Constitutional: He is oriented to person, place, and time. He appears well-developed and well-nourished.  Cardiovascular: Normal rate, regular rhythm and intact distal pulses.  Exam reveals no gallop and no friction rub.   No murmur heard. Pulmonary/Chest: No respiratory distress. He has no wheezes. He has no rales.  Musculoskeletal:       Right knee: He exhibits swelling (over area depicted). He exhibits normal range of motion, no effusion, no ecchymosis, no  deformity, no laceration, no erythema and normal patellar mobility. Tenderness (over area depicted) found.       Legs: Neurological: He is alert and oriented to person, place, and time.  Skin: Skin is warm and dry.   Results for orders placed or performed in visit on 03/10/16 (from the past 24 hour(s))  POCT CBC     Status: Abnormal   Collection Time: 03/10/16  8:51 AM  Result Value Ref Range   WBC 10.2 4.6 - 10.2 K/uL   Lymph, poc 5.3 (A) 0.6 - 3.4   POC LYMPH PERCENT 51.6 (A) 10 - 50 %L   MID (cbc) 0.7 0 - 0.9   POC MID % 7.0 0 - 12 %M   POC Granulocyte 4.2 2 - 6.9   Granulocyte percent 41.4 37 - 80 %G   RBC 4.74 4.69 - 6.13 M/uL   Hemoglobin 14.9 14.1 - 18.1 g/dL   HCT, POC 41.0 (A) 43.5 - 53.7 %   MCV 86.5 80 - 97 fL   MCH, POC 31.4 (A) 27 - 31.2 pg   MCHC 36.3 (A) 31.8 - 35.4 g/dL   RDW, POC 13.8 %   Platelet Count, POC 233 142 - 424 K/uL   MPV 6.5 0 - 99.8 fL   Assessment and Plan :   This case was precepted with Dr. Reginia Forts.   1. Right leg swelling - Unclear etiology, [place] pending.  2. Urinary frequency 3. History of prostate cancer - Counseled patient on use of Flomax. He will start a trial and let me know how he does.  Jaynee Eagles,  PA-C [facility] [phone] 03/10/2016 8:30 AM

## 2016-03-10 NOTE — Patient Instructions (Addendum)
You have an appointment at 2pm at [facility] for your doppler. [place].  Phone [phone]. You are to arrive at 145pm.    IF you received an x-ray today, you will receive an invoice from [facility]. Please contact [facility] at [phone] with questions or concerns regarding your invoice.   IF you received labwork today, you will receive an invoice from [employer]. Please contact [facility] at [phone] with questions or concerns regarding your invoice.   Our billing staff will not be able to assist you with questions regarding bills from these companies.  You will be contacted with the lab results as soon as they are available. The fastest way to get your results is to activate your My Chart account. Instructions are located on the last page of this paperwork. If you have not heard from [place] regarding the results in 2 weeks, please contact this office.

## 2016-03-14 NOTE — Telephone Encounter (Signed)
Looks like Freida Busman spoke to pt already.

## 2016-03-17 NOTE — Telephone Encounter (Signed)
Already discussed results with patient.

## 2016-08-24 DIAGNOSIS — Z23 Encounter for immunization: Secondary | ICD-10-CM | POA: Diagnosis not present

## 2016-09-28 DIAGNOSIS — H2513 Age-related nuclear cataract, bilateral: Secondary | ICD-10-CM | POA: Diagnosis not present

## 2016-09-28 DIAGNOSIS — H43813 Vitreous degeneration, bilateral: Secondary | ICD-10-CM | POA: Diagnosis not present

## 2017-08-09 ENCOUNTER — Ambulatory Visit: Payer: Medicare Other | Admitting: Family Medicine

## 2017-08-09 NOTE — Progress Notes (Deleted)
Patient ID: Dominic Butler, male   DOB: 11/08/1941, 76 y.o.   MRN: [id number] Subjective:    Dominic Butler is a 76 y.o. male who presents for Medicare Annual/Subsequent preventive examination.   Preventive Screening-Counseling & Management  Tobacco History  Smoking Status  . Current Every Day Smoker  . Types: Pipe  Smokeless Tobacco  . Never Used    Problems Prior to Visit 1. EKG with RBBB dating back to 2012.   Current Problems (verified) There are no active problems to display for this patient.   Medications Prior to Visit Current Outpatient Prescriptions on File Prior to Visit  Medication Sig Dispense Refill  . tamsulosin (FLOMAX) 0.4 MG CAPS capsule Take 1 capsule (0.4 mg total) by mouth daily. 30 capsule 3   No current facility-administered medications on file prior to visit.     Current Medications (verified) Current Outpatient Prescriptions  Medication Sig Dispense Refill  . tamsulosin (FLOMAX) 0.4 MG CAPS capsule Take 1 capsule (0.4 mg total) by mouth daily. 30 capsule 3   No current facility-administered medications for this visit.      Allergies (verified) Patient has no known allergies.   PAST HISTORY  Family History No family history on file.  Social History Social History  Substance Use Topics  . Smoking status: Current Every Day Smoker    Types: Pipe  . Smokeless tobacco: Never Used  . Alcohol use No    Are there smokers in your home (other than you)?  {yes/no:20286}  Risk Factors Current exercise habits: {exercise:19826}  Dietary issues discussed: ***   Cardiac risk factors: {risk factors:510}.  Depression Screen (Note: if answer to either of the following is "Yes", a more complete depression screening is indicated)   Q1: Over the past two weeks, have you felt down, depressed or hopeless? {yes/no:20286}  Q2: Over the past two weeks, have you felt little interest or pleasure in doing things? {yes/no:20286}  Have you lost interest  or pleasure in daily life? {yes/no:20286}  Do you often feel hopeless? {yes/no:20286}  Do you cry easily over simple problems? {yes/no:20286}  Activities of Daily Living In your present state of health, do you have any difficulty performing the following activities?:  Driving? {yes/no:20286} Managing money?  {Responses; yes/no (default no):140031::"No"} Feeding yourself? {yes/no:20286} Getting from bed to chair? {yes/no:20286} Climbing a flight of stairs? {yes/no:20286} Preparing food and eating?: {yes/no (default no):140031::"No"} Bathing or showering? {Responses; yes/no (default no):140031::"No"} Getting dressed: {yes/no (default no):140031::"No"} Getting to the toilet? {Responses; yes/no (default no):140031::"No"} Using the toilet:{yes/no (default no):140031::"No"} Moving around from place to place: {yes/no (default no):140031::"No"} In the past year have you fallen or had a near fall?:{yes/no (default no):140031::"No"}   Are you sexually active?  {yes/no:20286}  Do you have more than one partner?  {Responses; yes/no (default no):140031::"No"}  Hearing Difficulties: {yes/no (default no):140031::"No"} Do you often ask people to speak up or repeat themselves? {Responses; yes/no (default no):140031::"No"} Do you experience ringing or noises in your ears? {Responses; yes/no (default no):140031::"No"} Do you have difficulty understanding soft or whispered voices? {Responses; yes/no (default no):140031::"No"}   Do you feel that you have a problem with memory? {yes/no:20286}  Do you often misplace items? {yes/no:20286}  Do you feel safe at home?  {yes/no:20286}  Cognitive Testing  Alert? {yes/no:20286}  Normal Appearance?{yes/no:20286}  Oriented to person? {yes/no:20286}  Place? {yes/no:20286}   Time? {yes/no:20286}  Recall of three objects?  {yes/no:20286}  Can perform simple calculations? {yes/no:20286}  Displays appropriate judgment?{yes/no:20286}  Can read the correct  time from  a watch face?{yes/no:20286}   Advanced Directives have been discussed with the patient? {yes/no:20286}   List the Names of Other Physician/Practitioners you currently use: 1.  Ophtho -   Indicate any recent Medical Services you may have received from other than [facility] providers in the past year (date may be approximate).  Immunization History  Administered Date(s) Administered  . Influenza-Unspecified 08/24/2016    Screening Tests Health Maintenance  Topic Date Due  . TETANUS/TDAP  10/04/1962  . PNA vac Low Risk Adult (1 of 2 - PCV13) 10/04/2008  . COLONOSCOPY  03/27/2013  . INFLUENZA VACCINE  06/21/2017    All answers were reviewed with the patient and necessary referrals were made:  Diany Formosa, MD   08/09/2017   History reviewed: {history reviewed:20406::"allergies","current medications","past family history","past medical history","past social history","past surgical history","problem list"}  Review of Systems {ros; complete:30496}    Objective:     Vision by Snellen chart: right eye:{vision:19455::"20/20"}, left eye:{vision:19455::"20/20"} There were no vitals taken for this visit. There is no height or weight on file to calculate BMI.  {Exam, GDJM:42683}     Assessment:     ***     Plan:     During the course of the visit the patient was educated and counseled about appropriate screening and preventive services including:    {plan:19837}  Diet review for nutrition referral? Yes ____  Not Indicated ____   Patient Instructions (the written plan) was given to the patient.  Medicare Attestation I have personally reviewed: The patient's medical and social history Their use of alcohol, tobacco or illicit drugs Their current medications and supplements The patient's functional ability including ADLs,fall risks, home safety risks, cognitive, and hearing and visual impairment Diet and physical activities Evidence for depression or mood disorders  The  patient's weight, height, BMI, and visual acuity have been recorded in the chart.  I have made referrals, counseling, and provided education to the patient based on review of the above and I have provided the patient with a written personalized care plan for preventive services.     Delman Cheadle, MD   08/09/2017

## 2017-08-09 NOTE — Progress Notes (Deleted)
   Subjective:    Patient ID: Dominic Butler, male    DOB: February 16, 1941, 76 y.o.   MRN: [id number]  HPI Dominic Butler is a 76 yo male here today for his complete physical.   Primary Preventative Screenings: Prostate Cancer: h/o prostate cancer s/p radiation seeds. Follows with Dr. Jacqualin Combes at [facility]? STI screening: Colorectal Cancer: Colonoscopy by Dr. Penelope Coop with [name] 2004 in [place], h/o benign polyps, repeated around 2010?? Due for repeat? Tobacco use/AAA/Lung/EtOH/Illicit substances:  Cardiac: h/o sinus brady with RBBB on prior dating back to 2012, last EKG Weight/Blood sugar/Diet/Exercise:: OTC/Vit/Supp/Herbal: Dentist/Optho: Sees ophtho reg. No h/o g/c. Immunizations:  Prior care at [facility] with Dr. [name] - so might not have complete vaccine info.  Chronic Medical Conditions: Chronic hematuria of unknown etiology - s/p full w/u with urology Tobacco use: smokes a pipe, only takes a few puffs - more of a comfort measure. He does not want to quit.  Review of Systems     Objective:   Physical Exam        Assessment & Plan:  Make appt for AWV with Calandra. Ua, lipid, cmp, cbc, ??tsh ??psa shingrix

## 2017-08-16 ENCOUNTER — Ambulatory Visit (INDEPENDENT_AMBULATORY_CARE_PROVIDER_SITE_OTHER): Payer: Medicare Other | Admitting: Family Medicine

## 2017-08-16 ENCOUNTER — Encounter: Payer: Self-pay | Admitting: Family Medicine

## 2017-08-16 VITALS — BP 112/60 | HR 54 | Temp 97.7°F | Resp 18 | Ht 68.5 in | Wt 156.0 lb

## 2017-08-16 DIAGNOSIS — Z1389 Encounter for screening for other disorder: Secondary | ICD-10-CM

## 2017-08-16 DIAGNOSIS — Z1329 Encounter for screening for other suspected endocrine disorder: Secondary | ICD-10-CM

## 2017-08-16 DIAGNOSIS — Z136 Encounter for screening for cardiovascular disorders: Secondary | ICD-10-CM

## 2017-08-16 DIAGNOSIS — N400 Enlarged prostate without lower urinary tract symptoms: Secondary | ICD-10-CM

## 2017-08-16 DIAGNOSIS — I4510 Unspecified right bundle-branch block: Secondary | ICD-10-CM | POA: Diagnosis not present

## 2017-08-16 DIAGNOSIS — Z23 Encounter for immunization: Secondary | ICD-10-CM

## 2017-08-16 DIAGNOSIS — Z0000 Encounter for general adult medical examination without abnormal findings: Secondary | ICD-10-CM

## 2017-08-16 DIAGNOSIS — Z1383 Encounter for screening for respiratory disorder NEC: Secondary | ICD-10-CM

## 2017-08-16 DIAGNOSIS — Z125 Encounter for screening for malignant neoplasm of prostate: Secondary | ICD-10-CM

## 2017-08-16 DIAGNOSIS — R011 Cardiac murmur, unspecified: Secondary | ICD-10-CM | POA: Diagnosis not present

## 2017-08-16 DIAGNOSIS — Z1211 Encounter for screening for malignant neoplasm of colon: Secondary | ICD-10-CM | POA: Diagnosis not present

## 2017-08-16 DIAGNOSIS — D72829 Elevated white blood cell count, unspecified: Secondary | ICD-10-CM | POA: Diagnosis not present

## 2017-08-16 DIAGNOSIS — Z130 Encounter for screening for diseases of the blood and blood-forming organs and certain disorders involving the immune mechanism: Secondary | ICD-10-CM

## 2017-08-16 DIAGNOSIS — L409 Psoriasis, unspecified: Secondary | ICD-10-CM | POA: Diagnosis not present

## 2017-08-16 DIAGNOSIS — Z8546 Personal history of malignant neoplasm of prostate: Secondary | ICD-10-CM | POA: Diagnosis not present

## 2017-08-16 DIAGNOSIS — R234 Changes in skin texture: Secondary | ICD-10-CM | POA: Diagnosis not present

## 2017-08-16 DIAGNOSIS — Z1212 Encounter for screening for malignant neoplasm of rectum: Secondary | ICD-10-CM

## 2017-08-16 LAB — POCT URINALYSIS DIP (MANUAL ENTRY)
BILIRUBIN UA: NEGATIVE
Glucose, UA: NEGATIVE mg/dL
Ketones, POC UA: NEGATIVE mg/dL
Leukocytes, UA: NEGATIVE
Nitrite, UA: NEGATIVE
Protein Ur, POC: NEGATIVE mg/dL
SPEC GRAV UA: 1.015 (ref 1.010–1.025)
UROBILINOGEN UA: 0.2 U/dL
pH, UA: 6 (ref 5.0–8.0)

## 2017-08-16 MED ORDER — ZOSTER VAC RECOMB ADJUVANTED 50 MCG/0.5ML IM SUSR: 0.5000 mL | Freq: Once | INTRAMUSCULAR | 1 refills | Status: AC

## 2017-08-16 NOTE — Progress Notes (Deleted)
Subjective:    Patient ID: Dominic Butler, male    DOB: 19-Oct-1941, 76 y.o.   MRN: [id number]  HPI  Dominic Butler is a 76 yo male here today for his complete physical.   Primary Preventative Screenings: Prostate Cancer: h/o prostate cancer s/p radiation seeds. Follows with Dr. Jacqualin Combes at [facility]? STI screening: Colorectal Cancer: Colonoscopy by Dr. Penelope Coop with [name] 2004 in [place], h/o benign polyps, repeated around 2010?? Due for repeat? Tobacco use/AAA/Lung/EtOH/Illicit substances:  Cardiac: h/o sinus brady with RBBB on prior dating back to 2012, last EKG Weight/Blood sugar/Diet/Exercise:: OTC/Vit/Supp/Herbal: Dentist/Optho: Sees ophtho reg. No h/o g/c. Immunizations:  Prior care at [facility] with Dr. [name] - so might not have complete vaccine info.  Chronic Medical Conditions: Chronic hematuria of unknown etiology - s/p full w/u with urology Tobacco use: smokes a pipe, only takes a few puffs - more of a comfort measure. He does not want to quit.  Lab Results  Component Value Date   PSA 0.05 02/20/2015     Review of Systems     Objective:   Physical Exam        Assessment & Plan:  Make appt for AWV with [facility]. Ua, lipid, cmp, cbc, ??tsh ??psa shingrix  1. Annual physical exam   2. Screening for cardiovascular, respiratory, and genitourinary diseases   3. Screening for colorectal cancer   4. Screening for deficiency anemia   5. Screening for prostate cancer   6. Screening for thyroid disorder   7. Benign prostatic hyperplasia, unspecified whether lower urinary tract symptoms present   8. Need for prophylactic vaccination and inoculation against influenza   9. Need for prophylactic vaccination against Streptococcus pneumoniae (pneumococcus)   10. History of prostate cancer   11. Newly recognized heart murmur   12. 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 Flu  Vaccine QUAD 36+ mos IM  . Pneumococcal conjugate vaccine 13-valent  . CBC  . Comprehensive metabolic panel    Order Specific Question:   Has the patient fasted?    Answer:   Yes  . TSH  . PSA  . Lipid panel  . Ambulatory referral to Gastroenterology    Referral Priority:   Routine    Referral Type:   Consultation    Referral Reason:   Specialty Services Required    Number of Visits Requested:   1  . POCT urinalysis dipstick  . EKG 12-Lead  . ECHOCARDIOGRAM COMPLETE    Standing Status:   Future    Standing Expiration Date:   11/15/2018    Order Specific Question:   Where should this test be performed    Answer:   [facility]    Order Specific Question:   Perflutren DEFINITY (image enhancing agent) should be administered unless hypersensitivity or allergy exist    Answer:   Administer Perflutren    Order Specific Question:   Expected Date:    Answer:   1 month    Meds ordered this encounter  Medications  . Zoster Vac Recomb Adjuvanted [facility]) injection    Sig: Inject 0.5 mLs into the muscle once. Repeat the dose once in 2- 6 months.    Dispense:  0.5 mL    Refill:  1    I personally performed the services described in this documentation, which was scribed in my presence. The recorded information has been reviewed and considered, and addended  by me as needed.   Delman Cheadle, M.D.  Primary Care at [facility] [address] [place], [place] [address] [phone] phone [phone] fax  08/19/17 11:50 AM

## 2017-08-16 NOTE — Patient Instructions (Addendum)
Make an appointment for an Annual Wellness Visit with Andrez Grime - our [place].    IF you received an x-ray today, you will receive an invoice from [facility]. Please contact [facility] at [phone] with questions or concerns regarding your invoice.   IF you received labwork today, you will receive an invoice from [facility]. Please contact [facility] at [phone] with questions or concerns regarding your invoice.   Our billing staff will not be able to assist you with questions regarding bills from these companies.  You will be contacted with the lab results as soon as they are available. The fastest way to get your results is to activate your My Chart account. Instructions are located on the last page of this paperwork. If you have not heard from [place] regarding the results in 2 weeks, please contact this office.      Heart Murmur A heart murmur is an extra sound that is caused by chaotic blood flow. The murmur can be heard as a "hum" or "whoosh" sound when blood flows through the heart. The heart has four areas called chambers. Valves separate the upper and lower chambers from each other (tricuspid valve and mitral valve) and separate the lower chambers of the heart from pathways that lead away from the heart (aortic valve and pulmonary valve). Normally, the valves open to let blood flow through or out of your heart, and then they shut to keep the blood from flowing backward. There are two types of heart murmurs:  Innocent murmurs. Most people with this type of heart murmur do not have a heart problem. Many children have innocent heart murmurs. Your health care provider may suggest some basic testing to find out whether your murmur is an innocent murmur. If an innocent heart murmur is found, there is no need for further tests or treatment and no need to restrict activities or stop playing sports.  Abnormal murmurs. These types of murmurs can occur in  children and adults. Abnormal murmurs may be a sign of a more serious heart condition, such as a heart defect present at birth (congenital defect) or heart valve disease.  What are the causes? This condition is caused by heart valves that are not working properly. In children, abnormal heart murmurs are typically caused by congenital defects. In adults, abnormal murmurs are usually from heart valve problems caused by disease, infection, or aging. Three types of heart valve defects can cause a murmur:  Regurgitation. This is when blood leaks back through the valve in the wrong direction.  Mitral valve prolapse. This is when the mitral valve of the heart has a loose flap and does not close tightly.  Stenosis. This is when a valve does not open enough and blocks blood flow.  This condition may also be caused by:  Pregnancy.  Fever.  Overactive thyroid gland.  Anemia.  Exercise.  Rapid growth spurts (in children).  What are the signs or symptoms? Innocent murmurs do not cause symptoms, and many people with abnormal murmurs may or may not have symptoms. If symptoms do develop, they may include:  Shortness of breath.  Blue coloring of the skin, especially on the fingertips.  Chest pain.  Palpitations, or feeling a fluttering or skipped heartbeat.  Fainting.  Persistent cough.  Getting tired much faster than expected.  Swelling in the abdomen, feet, or ankles.  How is this diagnosed? This condition may be diagnosed during a routine physical or other exam. If your health care provider  hears a murmur with a stethoscope, he or she will listen for:  Where the murmur is located in your heart.  How long the murmur lasts (duration).  When the murmur is heard during the heartbeat.  How loud the murmur is. This may help the health care provider figure out what is causing the murmur.  You may be referred to a heart specialist (cardiologist). You may also have other tests,  including:  Electrocardiogram (ECG or EKG). This test measures the electrical activity of your heart.  Echocardiogram. This test uses high frequency sound waves to make pictures of your heart.  MRI or chest X-ray.  Cardiac catheterization. This test looks at blood flow through the heart.  For children and adults who have an abnormal heart murmur and want to stay active, it is important to complete testing, review test results, and receive recommendations from your health care provider. If heart disease is present, it may not be safe to play or be active. How is this treated? Heart murmurs themselves do not need treatment. In some cases, a heart murmur may go away on its own. If an underlying problem or disease is causing the murmur, you may need treatment. If treatment is needed, it will depend on the type and severity of the disease or heart problem causing the murmur. Treatment may include:  Medicine.  Surgery.  Dietary and lifestyle changes.  Follow these instructions at home:  Talk with your health care provider before participating in sports or other activities that require a lot of effort and energy (are strenuous).  Learn as much as possible about your condition and any related diseases. Ask your health care provider if you may at risk for any medical emergencies.  Talk with your health care provider about what symptoms you should look out for.  It is up to you to get your test results. Ask your health care provider, or the department that is doing the test, when your results will be ready.  Keep all follow-up visits as told by your health care provider. This is important. Contact a health care provider if:  You feel light-headed.  You are frequently short of breath.  You feel more tired than usual.  You are having a hard time keeping up with normal activities or fitness routines.  You have swelling in your ankles or feet.  You have chest pain.  You notice that  your heart often beats irregularly.  You develop any new symptoms. Get help right away if:  You develop severe chest pain.  You are having trouble breathing.  You have fainting spells.  Your symptoms suddenly get worse. These symptoms may represent a serious problem that is an emergency. Do not wait to see if the symptoms will go away. Get medical help right away. Call your local emergency services (911 in the [place] Do not drive yourself to the hospital. Summary  Normally, the heart valves open to let blood flow through or out of your heart, and then they shut to keep the blood from flowing backward.  Heart murmur is caused by heart valves that are not working properly.  You may need treatment if an underlying problem or disease is causing the heart murmur. Treatment may include medicine, surgery, or dietary and lifestyle changes.  Talk with your health care provider before participating in sports or other activities that require a lot of effort and energy (are strenuous).  Talk with your health care provider about what symptoms you should watch  out for. This information is not intended to replace advice given to you by your health care provider. Make sure you discuss any questions you have with your health care provider. Document Released: 12/15/2004 Document Revised: 10/26/2016 Document Reviewed: 10/26/2016 Elsevier Interactive Patient Education  2017 [place] Maintenance, Male A healthy lifestyle and preventive care is important for your health and wellness. Ask your health care provider about what schedule of regular examinations is right for you. What should I know about weight and diet? Eat a Healthy Diet  Eat plenty of vegetables, fruits, whole grains, low-fat dairy products, and lean protein.  Do not eat a lot of foods high in solid fats, added sugars, or salt.  Maintain a Healthy Weight Regular exercise can help you achieve or maintain a healthy weight.  You should:  Do at least 150 minutes of exercise each week. The exercise should increase your heart rate and make you sweat (moderate-intensity exercise).  Do strength-training exercises at least twice a week.  Watch Your Levels of Cholesterol and Blood Lipids  Have your blood tested for lipids and cholesterol every 5 years starting at 74 years of age. If you are at high risk for heart disease, you should start having your blood tested when you are 74 years old. You may need to have your cholesterol levels checked more often if: ? Your lipid or cholesterol levels are high. ? You are older than 74 years of age. ? You are at high risk for heart disease.  What should I know about cancer screening? Many types of cancers can be detected early and may often be prevented. Lung Cancer  You should be screened every year for lung cancer if: ? You are a current smoker who has smoked for at least 30 years. ? You are a former smoker who has quit within the past 15 years.  Talk to your health care provider about your screening options, when you should start screening, and how often you should be screened.  Colorectal Cancer  Routine colorectal cancer screening usually begins at 74 years of age and should be repeated every 5-10 years until you are 74 years old. You may need to be screened more often if early forms of precancerous polyps or small growths are found. Your health care provider may recommend screening at an earlier age if you have risk factors for colon cancer.  Your health care provider may recommend using home test kits to check for hidden blood in the stool.  A small camera at the end of a tube can be used to examine your colon (sigmoidoscopy or colonoscopy). This checks for the earliest forms of colorectal cancer.  Prostate and Testicular Cancer  Depending on your age and overall health, your health care provider may do certain tests to screen for prostate and testicular  cancer.  Talk to your health care provider about any symptoms or concerns you have about testicular or prostate cancer.  Skin Cancer  Check your skin from head to toe regularly.  Tell your health care provider about any new moles or changes in moles, especially if: ? There is a change in a mole's size, shape, or color. ? You have a mole that is larger than a pencil eraser.  Always use sunscreen. Apply sunscreen liberally and repeat throughout the day.  Protect yourself by wearing long sleeves, pants, a wide-brimmed hat, and sunglasses when outside.  What should I know about heart disease, diabetes, and high blood pressure?  If you are 21-41 years of age, have your blood pressure checked every 3-5 years. If you are 17 years of age or older, have your blood pressure checked every year. You should have your blood pressure measured twice-once when you are at a hospital or clinic, and once when you are not at a hospital or clinic. Record the average of the two measurements. To check your blood pressure when you are not at a hospital or clinic, you can use: ? An automated blood pressure machine at a pharmacy. ? A home blood pressure monitor.  Talk to your health care provider about your target blood pressure.  If you are between 62-55 years old, ask your health care provider if you should take aspirin to prevent heart disease.  Have regular diabetes screenings by checking your fasting blood sugar level. ? If you are at a normal weight and have a low risk for diabetes, have this test once every three years after the age of 44. ? If you are overweight and have a high risk for diabetes, consider being tested at a younger age or more often.  A one-time screening for abdominal aortic aneurysm (AAA) by ultrasound is recommended for men aged 22-75 years who are current or former smokers. What should I know about preventing infection? Hepatitis B If you have a higher risk for hepatitis B, you  should be screened for this virus. Talk with your health care provider to find out if you are at risk for hepatitis B infection. Hepatitis C Blood testing is recommended for:  Everyone born from [birth year] through 1963.  Anyone with known risk factors for hepatitis C.  Sexually Transmitted Diseases (STDs)  You should be screened each year for STDs including gonorrhea and chlamydia if: ? You are sexually active and are younger than 74 years of age. ? You are older than 74 years of age and your health care provider tells you that you are at risk for this type of infection. ? Your sexual activity has changed since you were last screened and you are at an increased risk for chlamydia or gonorrhea. Ask your health care provider if you are at risk.  Talk with your health care provider about whether you are at high risk of being infected with HIV. Your health care provider may recommend a prescription medicine to help prevent HIV infection.  What else can I do?  Schedule regular health, dental, and eye exams.  Stay current with your vaccines (immunizations).  Do not use any tobacco products, such as cigarettes, chewing tobacco, and e-cigarettes. If you need help quitting, ask your health care provider.  Limit alcohol intake to no more than 2 drinks per day. One drink equals 12 ounces of beer, 5 ounces of wine, or 1 ounces of hard liquor.  Do not use street drugs.  Do not share needles.  Ask your health care provider for help if you need support or information about quitting drugs.  Tell your health care provider if you often feel depressed.  Tell your health care provider if you have ever been abused or do not feel safe at home. This information is not intended to replace advice given to you by your health care provider. Make sure you discuss any questions you have with your health care provider. Document Released: 05/05/2008 Document Revised: 07/06/2016 Document Reviewed:  08/11/2015 Elsevier Interactive Patient Education  [employer].

## 2017-08-16 NOTE — Progress Notes (Addendum)
Subjective:  By signing my name below, I, Dominic Butler, attest that this documentation has been prepared under the direction and in the presence of Dominic Cheadle, MD Electronically Signed: Ladene Artist, ED Scribe 08/16/2017 at 3:37 PM.   Patient ID: Dominic Butler, male    DOB: 10-Jun-1941, 76 y.o.   MRN: [id number]  Chief Complaint  Patient presents with  . Annual Exam   HPI  Dominic Butler is a 76 yo male here today for his complete physical.   Primary Preventative Screenings: Prostate Cancer: h/o prostate cancer s/p radiation seeds. Used to follow with Dr. Jacqualin Combes at [facility] but hasn't been seen in years.  STI screening: Colorectal Cancer: Colonoscopy by Dr. Penelope [facility] with [facility] 2004 in [place], h/o benign polyps, possibly repeated around 2010. Due for repeat. Tobacco use/AAA/Lung/EtOH/Illicit substances: current smoker Cardiac: h/o sinus brady with RBBB on prior dating back to 2012, last EKG Weight/Blood sugar/Diet/Exercise: cholesterol checked in 2014 and 2016; normal.  OTC/Vit/Supp/Herbal: Dentist/Optho:  Had an eye exam with new glasses last year; hasn't seen Dominic Butler within a year. No h/o g/c. Immunizations: Prior care at [facility] with Dr. Janeice Butler - so might not have complete vaccine info. Pt thinks he received his pneumonia vaccine when he turned 76 y.o. Will receive influenza vaccine today.   Chronic Medical Conditions: Chronic hematuria of unknown etiology - s/p full w/u with urology. Denies nocturia, changes in urine, constipation.   Tobacco use: smokes a pipe, only takes a few puffs - more of a comfort measure. He does not want to quit.  Skin: Pt reports an intermittently pruritic rash to the neck on occiput/scalp for a while that he applies hand sanitizer to with some improvement of pruritis.   Past Medical History:  Diagnosis Date  . Allergy   . 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 Cancer  [facility])    Prostate cancer status post radiation seeds  . Elevated prostate specific antigen (PSA)    has had seed implants and also seen by [facility]  . Neuromuscular disorder [facility])    Past Surgical History:  Procedure Laterality Date  . APPENDECTOMY    . CHOLECYSTECTOMY    . INSERTION PROSTATE RADIATION SEED    . SHOULDER SURGERY     No current outpatient prescriptions on file prior to visit.   No current facility-administered medications on file prior to visit.    No Known Allergies History reviewed. No pertinent family history. Social History   Social History  . Marital status: Divorced    Spouse name: [name]  . Number of children: [name]  . Years of education: [name]   Social History Main Topics  . Smoking status: Current Every Day Smoker    Types: Pipe  . Smokeless tobacco: Never Used  . Alcohol use No  . Drug use: No  . Sexual activity: Not Asked   Other Topics Concern  . None   Social History Narrative  . None   Depression screen [facility] 2/9 08/16/2017 03/10/2016 01/30/2016 12/06/2015 06/24/2015  Decreased Interest 0 0 0 0 0  Down, Depressed, Hopeless 0 0 0 0 0  PHQ - 2 Score 0 0 0 0 0     Review of Systems  Gastrointestinal: Negative for constipation.  Genitourinary: Negative for difficulty urinating and frequency.  Skin: Positive for rash (neck/scalp).      Objective:   Physical Exam  Constitutional:  He is oriented to person, place, and time. He appears well-developed and well-nourished. No distress.  HENT:  Head: Normocephalic and atraumatic.  Right Ear: Tympanic membrane is injected and retracted.  Left Ear: Tympanic membrane is injected and retracted.  Mouth/Throat: Mucous membranes are normal. Posterior oropharyngeal erythema (mild) present.  Nares: boggy, R worse than L Mouth/Throat: postnasal drip  Eyes: Conjunctivae and EOM are normal.  Neck: Neck supple. No tracheal deviation present.  Cardiovascular: Regular rhythm.  Bradycardia present.     Murmur heard.  Systolic (injection at apex) murmur is present with a grade of 2/6  Pulses:      Dorsalis pedis pulses are 2+ on the right side, and 2+ on the left side.  Pulmonary/Chest: Effort normal and breath sounds normal. No respiratory distress.  Good air movement.  Abdominal: Soft. Bowel sounds are normal. He exhibits no distension and no pulsatile midline mass. There is no hepatosplenomegaly. There is no CVA tenderness.  Musculoskeletal: Normal range of motion. He exhibits no edema.  Neurological: He is alert and oriented to person, place, and time.  Skin: Skin is warm and dry. Rash noted.  Rash on nape of neck on occiput/posterior hair with erythematous plaque and silvery scale. Excoriations well defined border ~3x4 inches diameter with serpiginous border.  Psychiatric: He has a normal mood and affect. His behavior is normal.  Nursing note and vitals reviewed.  BP 112/60   Pulse (!) 54   Temp 97.7 F (36.5 C) (Oral)   Resp 18   Ht 5' 8.5" (1.74 m)   Wt 156 lb (70.8 kg)   SpO2 92%   BMI 23.37 kg/m     Results for orders placed or performed in visit on 08/16/17  POCT urinalysis dipstick  Result Value Ref Range   Color, UA yellow yellow   Clarity, UA clear clear   Glucose, UA negative negative mg/dL   Bilirubin, UA negative negative   Ketones, POC UA negative negative mg/dL   Spec Grav, UA 1.015 1.010 - 1.025   Blood, UA trace-lysed (A) negative   pH, UA 6.0 5.0 - 8.0   Protein Ur, POC negative negative mg/dL   Urobilinogen, UA 0.2 0.2 or 1.0 E.U./dL   Nitrite, UA Negative Negative   Leukocytes, UA Negative Negative   EKG reading done by Dominic Cheadle, MD: RBBB and sinus bradycardia agreed with computer interpretation. No significant change from last done 02/20/15.   Assessment & Plan:  Make appt for AWV with [facility]. Ua, lipid, cmp, cbc, ??tsh shingrix  Cancelled lipid panel and informed lab as pt did not want to pay out of pocket for it/sign ABN. However, it was  later added back on by lab and ran - medicare will likely deny after which hopefully his [place] will cover but if not, will need to dr bill it since it was run w/o the patient's permission.  1. Annual physical exam   2. Screening for cardiovascular, respiratory, and genitourinary diseases   3. Screening for colorectal cancer - to GI  4. Screening for deficiency anemia   5. Screening for prostate cancer   6. Screening for thyroid disorder   7. Benign prostatic hyperplasia, unspecified whether lower urinary tract symptoms present   8. Need for prophylactic vaccination and inoculation against influenza   9. Need for prophylactic vaccination against Streptococcus pneumoniae (pneumococcus)   10. History of prostate cancer - psa undetectable today. No longer following w/ urology  11. Newly recognized heart murmur - pt asymptomatic,  check echo  12. 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    Leukocytosis - on labs today, pt asymptomatic, recheck cbc w/ diff in lab only visit in about 2-4 wks to ensure resolved 14.    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  Orders Placed This Encounter  Procedures  . Flu Vaccine QUAD 36+ mos IM  . Pneumococcal conjugate vaccine 13-valent  . CBC  . Comprehensive metabolic panel    Order Specific Question:   Has the patient fasted?    Answer:   Yes  . TSH  . PSA  . Lipid panel  . Ambulatory referral to Gastroenterology    Referral Priority:   Routine    Referral Type:   Consultation    Referral Reason:   Specialty Services Required    Number of Visits Requested:   1  . POCT urinalysis dipstick  . EKG 12-Lead  . ECHOCARDIOGRAM COMPLETE    Standing Status:   Future    Standing Expiration Date:   11/15/2018    Order Specific Question:   Where should this test be performed    Answer:   [facility]    Order Specific  Question:   Perflutren DEFINITY (image enhancing agent) should be administered unless hypersensitivity or allergy exist    Answer:   Administer Perflutren    Order Specific Question:   Expected Date:    Answer:   1 month    Meds ordered this encounter  Medications  . Zoster Vac Recomb Adjuvanted [facility]) injection    Sig: Inject 0.5 mLs into the muscle once. Repeat the dose once in 2- 6 months.    Dispense:  0.5 mL    Refill:  1    I personally performed the services described in this documentation, which was scribed in my presence. The recorded information has been reviewed and considered, and addended by me as needed.   Dominic Butler, M.D.  Primary Care at [facility] [address] [place], [place] [address] [phone] phone [phone] fax  08/19/17 11:51 AM

## 2017-08-17 LAB — LIPID PANEL
CHOL/HDL RATIO: 2.3 ratio (ref 0.0–5.0)
CHOLESTEROL TOTAL: 173 mg/dL (ref 100–199)
HDL: 76 mg/dL (ref >39)
LDL CALC: 88 mg/dL (ref 0–99)
Triglycerides: 46 mg/dL (ref 0–149)
VLDL CHOLESTEROL CAL: 9 mg/dL (ref 5–40)

## 2017-08-17 LAB — COMPREHENSIVE METABOLIC PANEL
A/G RATIO: 1.9 (ref 1.2–2.2)
ALBUMIN: 4 g/dL (ref 3.5–4.8)
ALT: 13 IU/L (ref 0–44)
AST: 19 IU/L (ref 0–40)
Alkaline Phosphatase: 66 IU/L (ref 39–117)
BUN/Creatinine Ratio: 14 (ref 10–24)
BUN: 14 mg/dL (ref 8–27)
Bilirubin Total: 0.2 mg/dL (ref 0.0–1.2)
CALCIUM: 9 mg/dL (ref 8.6–10.2)
CHLORIDE: 104 mmol/L (ref 96–106)
CO2: 29 mmol/L (ref 20–29)
Creatinine, Ser: 0.98 mg/dL (ref 0.76–1.27)
GFR, EST AFRICAN AMERICAN: 88 mL/min/{1.73_m2} (ref >59)
GFR, EST NON AFRICAN AMERICAN: 76 mL/min/{1.73_m2} (ref >59)
GLOBULIN, TOTAL: 2.1 g/dL (ref 1.5–4.5)
GLUCOSE: 97 mg/dL (ref 65–99)
POTASSIUM: 4.6 mmol/L (ref 3.5–5.2)
SODIUM: 143 mmol/L (ref 134–144)
TOTAL PROTEIN: 6.1 g/dL (ref 6.0–8.5)

## 2017-08-17 LAB — CBC
HEMATOCRIT: 43.1 % (ref 37.5–51.0)
Hemoglobin: 14.5 g/dL (ref 13.0–17.7)
MCH: 30 pg (ref 26.6–33.0)
MCHC: 33.6 g/dL (ref 31.5–35.7)
MCV: 89 fL (ref 79–97)
Platelets: 227 10*3/uL (ref 150–379)
RBC: 4.84 x10E6/uL (ref 4.14–5.80)
RDW: 14 % (ref 12.3–15.4)
WBC: 13.4 10*3/uL — ABNORMAL HIGH (ref 3.4–10.8)

## 2017-08-17 LAB — TSH: TSH: 1.75 u[IU]/mL (ref 0.450–4.500)

## 2017-08-17 LAB — PSA: Prostate Specific Ag, Serum: <0.1 ng/mL (ref 0.0–4.0)

## 2017-08-29 ENCOUNTER — Ambulatory Visit: Payer: Medicare Other | Admitting: Urgent Care

## 2017-08-29 DIAGNOSIS — D72829 Elevated white blood cell count, unspecified: Secondary | ICD-10-CM

## 2017-08-29 LAB — CBC WITH DIFFERENTIAL/PLATELET
BASOS ABS: 0.1 10*3/uL (ref 0.0–0.2)
Basos: 1 %
EOS (ABSOLUTE): 0.4 10*3/uL (ref 0.0–0.4)
Eos: 3 %
HEMOGLOBIN: 14.5 g/dL (ref 13.0–17.7)
Hematocrit: 43.1 % (ref 37.5–51.0)
IMMATURE GRANS (ABS): 0 10*3/uL (ref 0.0–0.1)
Immature Granulocytes: 0 %
LYMPHS ABS: 8.5 10*3/uL — AB (ref 0.7–3.1)
LYMPHS: 65 %
MCH: 29.6 pg (ref 26.6–33.0)
MCHC: 33.6 g/dL (ref 31.5–35.7)
MCV: 88 fL (ref 79–97)
MONOCYTES: 5 %
Monocytes Absolute: 0.7 10*3/uL (ref 0.1–0.9)
Neutrophils Absolute: 3.4 10*3/uL (ref 1.4–7.0)
Neutrophils: 26 %
Platelets: 235 10*3/uL (ref 150–379)
RBC: 4.9 x10E6/uL (ref 4.14–5.80)
RDW: 14 % (ref 12.3–15.4)
WBC: 13 10*3/uL — AB (ref 3.4–10.8)

## 2017-09-01 ENCOUNTER — Other Ambulatory Visit: Payer: Self-pay

## 2017-09-01 ENCOUNTER — Ambulatory Visit (HOSPITAL_COMMUNITY): Payer: Medicare Other | Attending: Internal Medicine

## 2017-09-01 DIAGNOSIS — R011 Cardiac murmur, unspecified: Secondary | ICD-10-CM

## 2017-09-01 DIAGNOSIS — Z720 Tobacco use: Secondary | ICD-10-CM | POA: Insufficient documentation

## 2017-09-01 DIAGNOSIS — I350 Nonrheumatic aortic (valve) stenosis: Secondary | ICD-10-CM | POA: Diagnosis not present

## 2017-09-02 NOTE — Addendum Note (Signed)
Addended by: Shawnee Knapp on: 09/02/2017 02:51 AM   Modules accepted: Orders

## 2017-11-07 ENCOUNTER — Encounter: Payer: Self-pay | Admitting: Family Medicine

## 2017-11-07 DIAGNOSIS — K635 Polyp of colon: Secondary | ICD-10-CM | POA: Diagnosis not present

## 2017-11-07 DIAGNOSIS — Z86010 Personal history of colonic polyps: Secondary | ICD-10-CM | POA: Diagnosis not present

## 2017-11-07 DIAGNOSIS — K5730 Diverticulosis of large intestine without perforation or abscess without bleeding: Secondary | ICD-10-CM | POA: Diagnosis not present

## 2017-11-07 LAB — HM COLONOSCOPY

## 2017-11-09 DIAGNOSIS — K635 Polyp of colon: Secondary | ICD-10-CM | POA: Diagnosis not present

## 2017-11-10 NOTE — Addendum Note (Signed)
Addended by: Shawnee Knapp on: 11/10/2017 10:07 AM   Modules accepted: Level of Service

## 2017-12-22 ENCOUNTER — Ambulatory Visit (INDEPENDENT_AMBULATORY_CARE_PROVIDER_SITE_OTHER): Payer: Medicare Other

## 2017-12-22 ENCOUNTER — Ambulatory Visit (INDEPENDENT_AMBULATORY_CARE_PROVIDER_SITE_OTHER): Payer: Medicare Other | Admitting: Family Medicine

## 2017-12-22 ENCOUNTER — Other Ambulatory Visit: Payer: Self-pay

## 2017-12-22 ENCOUNTER — Encounter: Payer: Self-pay | Admitting: Family Medicine

## 2017-12-22 VITALS — BP 134/70 | HR 56 | Temp 98.0°F | Resp 16 | Ht 69.0 in | Wt 161.6 lb

## 2017-12-22 DIAGNOSIS — S6722XA Crushing injury of left hand, initial encounter: Secondary | ICD-10-CM

## 2017-12-22 DIAGNOSIS — M79642 Pain in left hand: Secondary | ICD-10-CM | POA: Diagnosis not present

## 2017-12-22 DIAGNOSIS — S6992XA Unspecified injury of left wrist, hand and finger(s), initial encounter: Secondary | ICD-10-CM | POA: Diagnosis not present

## 2017-12-22 MED ORDER — NAPROXEN 500 MG PO TABS: 500.0000 mg | ORAL_TABLET | Freq: Two times a day (BID) | ORAL | 0 refills | Status: DC

## 2017-12-22 NOTE — Patient Instructions (Addendum)
Dg Hand Complete Left  Result Date: 12/22/2017 CLINICAL DATA:  Left hand pain after getting hit in the hand by a drill. EXAM: LEFT HAND - COMPLETE 3+ VIEW COMPARISON:  None. FINDINGS: Degenerative changes at the articulation of the 1st metacarpal with the trapezium and trapezoid. There are also degenerative changes involving the 2nd and 3rd MCP joints, 2nd DIP and PIP joints and 4th PIP joint. There are also degenerative changes involving the distal radioulnar joint with mild shortening of the distal ulna relative to the distal radius. No fracture or dislocation is seen. IMPRESSION: 1. No fracture seen. 2. Multi joint degenerative changes. 3. Mild negative ulnar variance. Electronically Signed   By: Claudie Revering M.D.   On: 12/22/2017 09:46   Ice for 20 minutes at least 4 times a day. Keep it wrapped with an ACE wrap and try to elevated for at least 3 days.  Take Naprosyn 2x/d  IF you received an x-ray today, you will receive an invoice from [facility]. Please contact [facility] at [phone] with questions or concerns regarding your invoice.   IF you received labwork today, you will receive an invoice from [facility]. Please contact [facility] at [phone] with questions or concerns regarding your invoice.   Our billing staff will not be able to assist you with questions regarding bills from these companies.  You will be contacted with the lab results as soon as they are available. The fastest way to get your results is to activate your My Chart account. Instructions are located on the last page of this paperwork. If you have not heard from [place] regarding the results in 2 weeks, please contact this office.     Crush Injury of the Hand When a crush injury of the hand occurs, many structures within the hand and wrist joint can be affected. This can result in a complicated injury that may involve:  One or more broken (fractured) bones.  Lacerations or abrasions of the skin. These  increase your risk of infection.  Compressed or torn muscles.  Torn ligaments and tendons.  Broken blood vessels, causing bleeding within the tissues. This can lead to dangerously high pressure within the tissues (compartment syndrome).  Damage to nerves.  One or more finger amputations.  What are the causes? This type of injury can happen when a great amount of force is suddenly applied to the hand. This might occur:  During a motor vehicle accident.  If the hand is pulled into a machine during industrial or agricultural work.  If a heavy load falls directly onto the hand.  What are the signs or symptoms? Symptoms will vary depending on which structures in your hand have been injured. Symptoms may include:  Moderate or severe pain in the hand, wrist, or arm.  Bleeding at the site of injury.  Tingling, numbness, or loss of feeling (sensation) in part or all of your hand.  Loss of movement in part or all of your hand.  How is this diagnosed? Your health care provider will examine you and ask questions about how your injury happened. The exam may include checking for sensation and blood flow into your hand. You may also have tests, including X-rays and procedures to check the pressure in your hand. After initial treatment, additional studies may be done to further diagnose the extent of your injuries. These may include:  A nerve conduction study to determine how well the nerves are working in your arm and hand.  MRI to determine  if other injuries occurred that do not usually show up on X-ray.  How is this treated? Treatment for this condition depends on the severity of your crush injury. Treatment may include:  Thorough cleaning if you have an open wound. This may or may not require surgery.  Having a splint applied to your fingers, hand, or forearm.  Medicine to relieve pain.  Antibiotic medicine to prevent infection.  Stitches (sutures) to close open wounds.  One  or more surgeries to address injuries to skin, bones, joints, tendons, ligaments, muscles, nerves, or blood vessels.  Follow these instructions at home: If you have a splint:  Wear the splint as told by your health care provider. Remove it only as told by your health care provider.  Loosen the splint if your fingers tingle, become numb, or turn cold and blue.  Do not let your splint get wet if it is not waterproof.  Keep the splint clean. Wound care   If you have any skin wounds that were covered with bandages (dressings), follow instructions from your health care provider about how to take care of your wound. Make sure you: ? Wash your hands with soap and water before you change your dressing. If soap and water are not available, use hand sanitizer. ? Change your dressing as told by your health care provider. ? Leave stitches (sutures), skin glue, or adhesive strips in place. These skin closures may need to stay in place for 2 weeks or longer. If adhesive strip edges start to loosen and curl up, you may trim the loose edges. Do not remove adhesive strips completely unless your health care provider tells you to do that.  If you have skin wounds, check them every day for signs of infection. Check for: ? More redness, swelling, or pain. ? More fluid or blood. ? Warmth. ? Pus or a bad smell. Managing pain, stiffness, and swelling  If directed, put ice on the injured area. ? Put ice in a plastic bag. ? Place a towel between your skin and the bag. ? Leave the ice on for 20 minutes, 2-3 times a day.  Raise (elevate) the injured area above the level of your heart while you are sitting or lying down. Driving  Do not drive or operate heavy machinery while taking prescription pain medicine.  Ask your health care provider when it is safe to drive if you have a splint on your hand or arm. Activity  Return to your normal activities as told by your health care provider. Ask your health care  provider what activities are safe for you.  Work with a physical therapist (PT) or occupational therapist (OT) as told by your health care provider. General instructions  Do not put pressure on any part of the splint until it is fully hardened. This may take several hours.  If you have a splint and it is not waterproof, cover it with a watertight plastic bag when you take a bath or a shower.  Take over-the-counter and prescription medicines only as told by your health care provider.  If you were prescribed an antibiotic, take it as told by your health care provider. Do not stop taking the antibiotic before the prescription is done.  Do not use any tobacco products, such as cigarettes, chewing tobacco, and e-cigarettes. Tobacco can delay bone healing. If you need help quitting, ask your health care provider.  Keep all follow-up visits as told by your health care provider. This is important. These include  PT and OT visits. Contact a health care provider if:  A wound that was sutured opens up.  You have more redness, swelling, or pain in your hand.  You have more fluid or blood coming from your hand.  Your hand feels warm to the touch.  You have pus or a bad smell coming from your hand.  You have a fever. Get help right away if:  You suddenly develop severe pain in your hand.  You previously had sensation in your hand and you suddenly lose sensation.  Your wrist or hand becomes bent (contracted) involuntarily.  Your symptoms had improved and they suddenly get worse.  Your hand or fingers are turning pink or blue. This information is not intended to replace advice given to you by your health care provider. Make sure you discuss any questions you have with your health care provider. Document Released: 10/19/2015 Document Revised: 04/14/2016 Document Reviewed: 07/01/2015 Elsevier Interactive Patient Education  [employer].

## 2017-12-22 NOTE — Progress Notes (Signed)
Subjective:    Patient ID: Dominic Butler, male    DOB: Dec 18, 1940, 77 y.o.   MRN: [id number] Chief Complaint  Patient presents with  . Hand    left hand swollen, per pt" using a hand drill on 12/21/17, drill twisted and smacked hand"    HPI  Drill in right hand twisted and hit left that was bracing the wood.  Is right-hand dominant. Wrist feels fine. Hard to straighten fingers.  Hasn't taken any otc medicine for it. Iced once last night.  Past Medical History:  Diagnosis Date  . Allergy   . 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 Cancer [facility])    Prostate cancer status post radiation seeds  . Elevated prostate specific antigen (PSA)    has had seed implants and also seen by [facility]  . Neuromuscular disorder [facility])    Past Surgical History:  Procedure Laterality Date  . APPENDECTOMY    . CHOLECYSTECTOMY    . INSERTION PROSTATE RADIATION SEED    . SHOULDER SURGERY     No current outpatient medications on file prior to visit.   No current facility-administered medications on file prior to visit.    No Known Allergies No family history on file. Social History   Socioeconomic History  . Marital status: Divorced    Spouse name: None  . Number of children: None  . Years of education: None  . Highest education level: None  Social Needs  . Financial resource strain: None  . Food insecurity - worry: None  . Food insecurity - inability: None  . Transportation needs - medical: None  . Transportation needs - non-medical: None  Occupational History  . None  Tobacco Use  . Smoking status: Current Every Day Smoker    Types: Pipe  . Smokeless tobacco: Never Used  Substance and Sexual Activity  . Alcohol use: No  . Drug use: No  . Sexual activity: None  Other Topics Concern  . None  Social History Narrative  . None   Depression screen [facility] 2/9 12/22/2017 08/16/2017 03/10/2016 01/30/2016  12/06/2015  Decreased Interest 0 0 0 0 0  Down, Depressed, Hopeless 0 0 0 0 0  PHQ - 2 Score 0 0 0 0 0    Review of Systems  Constitutional: Positive for activity change. Negative for appetite change, chills, diaphoresis and fever.  Musculoskeletal: Positive for arthralgias, joint swelling and myalgias. Negative for gait problem.  Skin: Positive for color change. Negative for pallor, rash and wound.  Neurological: Positive for weakness. Negative for numbness.  Hematological: Negative for adenopathy. Does not bruise/bleed easily.  Psychiatric/Behavioral: Negative for sleep disturbance.       Objective:   Physical Exam  Constitutional: He is oriented to person, place, and time. He appears well-developed and well-nourished. No distress.  HENT:  Head: Normocephalic and atraumatic.  Eyes: No scleral icterus.  Cardiovascular:  2+ radial and ulnar pulses in left wrist  Pulmonary/Chest: Effort normal.  Musculoskeletal:       Left wrist: He exhibits normal range of motion, no tenderness, no bony tenderness, no swelling and no crepitus.       Left hand: He exhibits decreased range of motion, tenderness and swelling. He exhibits no bony tenderness and normal capillary refill. Normal sensation noted. Decreased strength (due to pain and decreased ROM) noted. He exhibits finger abduction and thumb/finger opposition. He exhibits  no wrist extension trouble.  Left hand with maroon bruising and impressive swelling over dorsum of hand metacarpals and MCPs, most over 1st-3rd metacarpals.  No tenderness w/ palpation of distal radius/ulna and carpal bones. No tenderness with palpation of metacarpals along palmar aspect. Mild reduction in thumb abduction and opposition. Normal adductions. Mild limitation in 2-5th MCP extension, moderate limitation in 2nd-5th MCP flexion due to pain and swelling.  Neurological: He is alert and oriented to person, place, and time.  Skin: Skin is warm and dry. He is not  diaphoretic.  Psychiatric: He has a normal mood and affect. His behavior is normal.    BP 134/70   Pulse (!) 56   Temp 98 F (36.7 C) (Oral)   Resp 16   Ht 5\' 9"  (1.753 m)   Wt 161 lb 9.6 oz (73.3 kg)   SpO2 96%   BMI 23.86 kg/m   Dg Hand Complete Left  Result Date: 12/22/2017 CLINICAL DATA:  Left hand pain after getting hit in the hand by a drill. EXAM: LEFT HAND - COMPLETE 3+ VIEW COMPARISON:  None. FINDINGS: Degenerative changes at the articulation of the 1st metacarpal with the trapezium and trapezoid. There are also degenerative changes involving the 2nd and 3rd MCP joints, 2nd DIP and PIP joints and 4th PIP joint. There are also degenerative changes involving the distal radioulnar joint with mild shortening of the distal ulna relative to the distal radius. No fracture or dislocation is seen. IMPRESSION: 1. No fracture seen. 2. Multi joint degenerative changes. 3. Mild negative ulnar variance. Electronically Signed   By: Claudie Revering M.D.   On: 12/22/2017 09:46       Assessment & Plan:   1. Hand injury, left, initial encounter   2. Crush injury to hand, left, initial encounter   No bony injury so sig soft tissue contusion. Rec RICE as much as poss x next sev days to aide in healing. Recheck in 1 wk if still residual sxs. Wrapped w/ 3 in ace wrap starting distally and progressing proximally.  Orders Placed This Encounter  Procedures  . DG Hand Complete Left    Standing Status:   Future    Number of Occurrences:   1    Standing Expiration Date:   12/22/2018    Order Specific Question:   Reason for Exam (SYMPTOM  OR DIAGNOSIS REQUIRED)    Answer:   using hand drill yesterday that slipped and smacked hand    Order Specific Question:   Preferred imaging location?    Answer:   External    Meds ordered this encounter  Medications  . naproxen (NAPROSYN) 500 MG tablet    Sig: Take 1 tablet (500 mg total) by mouth 2 (two) times daily with a meal.    Dispense:  20 tablet     Refill:  0     Delman Cheadle, M.D.  Primary Care at [facility] [address] [place], [place] [address] [phone] phone [phone] fax  12/24/17 6:53 AM

## 2018-01-26 ENCOUNTER — Ambulatory Visit (INDEPENDENT_AMBULATORY_CARE_PROVIDER_SITE_OTHER): Payer: Medicare Other | Admitting: Physician Assistant

## 2018-01-26 ENCOUNTER — Encounter: Payer: Self-pay | Admitting: Physician Assistant

## 2018-01-26 VITALS — BP 175/83 | HR 60 | Temp 97.8°F | Resp 18 | Ht 69.0 in | Wt 167.8 lb

## 2018-01-26 DIAGNOSIS — T148XXA Other injury of unspecified body region, initial encounter: Secondary | ICD-10-CM | POA: Diagnosis not present

## 2018-01-26 DIAGNOSIS — Z23 Encounter for immunization: Secondary | ICD-10-CM

## 2018-01-26 MED ORDER — CEPHALEXIN 500 MG PO CAPS
500.0000 mg | ORAL_CAPSULE | Freq: Three times a day (TID) | ORAL | 0 refills | Status: AC
Start: 2018-01-26 — End: 2018-01-29

## 2018-01-26 NOTE — Patient Instructions (Addendum)
Take keflex starting today.  Keep the wound dressed with the ointment and a bandaid.     Hand or Foot Foreign Body, Adult A foreign body is an object that gets into your body that should not be there. Your hands and feet are common entry points for foreign bodies. Common examples of foreign bodies include:  Splinters.  Thorns.  Pieces of glass, wood, metal, or plastic.  Metal objects such as needles, nails, and fishing hooks.  Foreign bodies that are not removed quickly may cause infection. What are the causes? A foreign body can get in your hand or foot through an injury, such as a scrape or cut, or if something punctures your skin. What are the signs or symptoms? Symptoms of having recently gotten a foreign body in your hand or foot include:  Pain.  Redness.  Swelling.  Tenderness.  Numbness or tingling.  Noticing something under the skin.  If the foreign body has caused an infection, symptoms may also include:  Fever.  Warmth in the area of the puncture.  A lump that you can feel under the skin.  Not being able to use your hand or put weight (bear weight) on your foot.  Greenish or yellow fluid coming from the puncture area.  How is this diagnosed? Your health care provider will diagnose a foreign body in the hand or foot by taking a medical history and examining your wound. If the foreign body is deep, you may also have tests, such as an X-ray, MRI, or ultrasound. In some cases, your health care provider may need to numb the area, open it up to examine what is inside your skin, and remove the object. How is this treated? Your health care provider may be able to remove the foreign body while exploring your wound during the diagnosis. If the foreign body is deep or in a wound that is infected, you may need to have a procedure to remove it. If an incision is needed, it may be closed with stitches (sutures), skin glue, or adhesive strips. A bandage (dressing) will be  placed over the incision. You may also need to get a tetanus shot or take antibiotic medicines to prevent or treat an infection. Follow these instructions at home: Medicines  Take over-the-counter and prescription medicines only as told by your health care provider.  If you were prescribed an antibiotic medicine, take the antibiotic as told by your health care provider. Do not stop taking the antibiotic even if you start to feel better. Wound care  Change your dressing as told by your health care provider.  Follow instructions from your health care provider about how to take care of your wound or incision. Make sure you: ? Wash your hands with soap and water before you change your dressing. If soap and water are not available, use hand sanitizer. ? Change your dressing as told by your health care provider. ? Leave sutures, skin glue, or adhesive strips in place. These skin closures may need to stay in place for 2 weeks or longer. If adhesive strip edges start to loosen and curl up, you may trim the loose edges. Do not remove adhesive strips completely unless your health care provider tells you to do that.  Check your puncture area or incision every day for signs of infection. Check for: ? More redness, swelling, or pain. ? More fluid or blood. ? Warmth. ? Pus or a bad smell. General instructions  Return to your normal activities as  told by your health care provider. Ask your health care provider what activities are safe for you.  Do not take baths, swim, or use a hot tub until your health care provider approves. Ask your health care provider if you may take showers. You may only be allowed to take sponge baths.  Keep all follow-up visits as told by your health care provider. This is important. How is this prevented? To protect yourself:  Wear gloves when working with sharp objects.  Do not walk barefoot in areas where there may be sharp objects.  Wear thick-soled shoes or boots  when walking in areas where there are sharp objects.  Contact a health care provider if:  You have a foreign body that has not come out or been removed.  You have more redness, swelling, or pain around the puncture area or incision.  You have more fluid or blood coming from the puncture area or incision.  Your puncture area or incision feels warm to the touch.  You have pus or a bad smell coming from the puncture area or incision.  You have a fever.  You were given a tetanus shot, and you have swelling, severe pain, redness, or bleeding at the injection site. Get help right away if:  Your puncture area becomes numb.  You cannot use your hand or foot. Summary  A foreign body is an object that gets into your body that should not be there. Your hands and feet are common entry points for foreign bodies.  Your health care provider may be able to remove the foreign body while exploring your wound during the diagnosis. If the foreign body is deep or in a wound that is infected, you may need to have a procedure to remove it.  You may also need to get a tetanus shot or take antibiotic medicines to prevent or treat an infection.  Check your puncture area or incision every day for signs of infection. This information is not intended to replace advice given to you by your health care provider. Make sure you discuss any questions you have with your health care provider. Document Released: 02/01/2017 Document Revised: 02/01/2017 Document Reviewed: 02/01/2017 Elsevier Interactive Patient Education  2018 [employer].     IF you received an x-ray today, you will receive an invoice from [facility]. Please contact [facility] at [phone] with questions or concerns regarding your invoice.   IF you received labwork today, you will receive an invoice from [facility]. Please contact [facility] at [phone] with questions or concerns regarding your invoice.   Our billing  staff will not be able to assist you with questions regarding bills from these companies.  You will be contacted with the lab results as soon as they are available. The fastest way to get your results is to activate your My Chart account. Instructions are located on the last page of this paperwork. If you have not heard from [place] regarding the results in 2 weeks, please contact this office.

## 2018-01-26 NOTE — Progress Notes (Signed)
01/26/2018 6:07 PM   DOB: December 06, 1940 / MRN: [id number]  SUBJECTIVE:  Dominic Butler is a 77 y.o. male presenting for "splinter in the hand."  His tetanus is out of date.  He has No Known Allergies.   He  has a past medical history of Allergy, Benign hematuria, Cancer ([place]), Elevated prostate specific antigen (PSA), and Neuromuscular disorder ([place]).    He  reports that he has been smoking pipe.  he has never used smokeless tobacco. He reports that he does not drink alcohol or use drugs. He  has no sexual activity history on file. The patient  has a past surgical history that includes Appendectomy; Insertion prostate radiation seed; Shoulder surgery; and Cholecystectomy.  His family history is not on file.  Review of Systems  Constitutional: Negative for chills, diaphoresis and fever.  Respiratory: Negative for shortness of breath.   Cardiovascular: Negative for chest pain, orthopnea and leg swelling.  Gastrointestinal: Negative for nausea.  Skin: Negative for rash.  Neurological: Negative for dizziness.    The problem list and medications were reviewed and updated by myself where necessary and exist elsewhere in the encounter.   OBJECTIVE:  BP (!) 175/83   Pulse 60   Temp 97.8 F (36.6 C) (Oral)   Resp 18   Ht 5\' 9"  (1.753 m)   Wt 167 lb 12.8 oz (76.1 kg)   SpO2 94%   BMI 24.78 kg/m    BP Readings from Last 3 Encounters:  01/26/18 (!) 175/83  12/22/17 134/70  08/16/17 112/60     Physical Exam  Constitutional: He appears well-developed. He is active and cooperative.  Non-toxic appearance.  Cardiovascular: Normal rate.  Pulmonary/Chest: Effort normal. No tachypnea.  Neurological: He is alert.  Skin: Skin is warm and dry. He is not diaphoretic. No pallor.  Vitals reviewed.   Procedure: The hand was anesthetized with 2% lidocaine with epinephrine.  A 2 cm splinter was pulled from the skin with toothed forceps.  Patient tolerated the procedure without complaint.   Post procedure the hand was washed thoroughly with soap and water and mupirocin along with a bandage was placed.  Wound instructions provided to patient per AVS.  He will come back if he has any difficulty.   No results found for this or any previous visit (from the past 72 hour(s)).  No results found.  ASSESSMENT AND PLAN:  Dominic Butler was seen today for foreign body in skin.  Diagnoses and all orders for this visit:  Splinter in skin: 2 cm splinter pulled from the superficial skin of the hand.  Patient tolerated this without complaint.  I am certain there are other smaller products of the splinter in the skin.  I have advised that he keep the wound dressed and I am giving him Keflex for 3 days to cover for a possible staph/strep infection.  TD vaccination given today.BP noted to be elevated however the patient was in pain today and this is not his baseline. -     cephALEXin (KEFLEX) 500 MG capsule; Take 1 capsule (500 mg total) by mouth 3 (three) times daily for 3 days. -     Td vaccine greater than or equal to 7yo preservative free IM  Other orders -     Cancel: Tdap vaccine greater than or equal to 7yo IM    The patient is advised to call or return to clinic if he does not see an improvement in symptoms, or to seek the care of  the closest emergency department if he worsens with the above plan.   Philis Fendt, MHS, PA-C [facility] 01/26/2018 6:07 PM

## 2018-01-29 ENCOUNTER — Ambulatory Visit: Payer: Medicare Other | Admitting: Physician Assistant

## 2018-02-09 DIAGNOSIS — H3509 Other intraretinal microvascular abnormalities: Secondary | ICD-10-CM | POA: Diagnosis not present

## 2018-02-09 DIAGNOSIS — H25043 Posterior subcapsular polar age-related cataract, bilateral: Secondary | ICD-10-CM | POA: Diagnosis not present

## 2018-02-09 DIAGNOSIS — H2513 Age-related nuclear cataract, bilateral: Secondary | ICD-10-CM | POA: Diagnosis not present

## 2018-02-09 DIAGNOSIS — H35033 Hypertensive retinopathy, bilateral: Secondary | ICD-10-CM | POA: Diagnosis not present

## 2018-03-15 ENCOUNTER — Ambulatory Visit: Payer: Self-pay

## 2018-03-15 ENCOUNTER — Other Ambulatory Visit: Payer: Self-pay

## 2018-03-15 ENCOUNTER — Ambulatory Visit (HOSPITAL_COMMUNITY)
Admission: RE | Admit: 2018-03-15 | Discharge: 2018-03-15 | Disposition: A | Discharge status: Home / Self Care | Payer: Medicare Other | Source: Ambulatory Visit | Attending: Physician Assistant | Admitting: Physician Assistant

## 2018-03-15 ENCOUNTER — Ambulatory Visit (INDEPENDENT_AMBULATORY_CARE_PROVIDER_SITE_OTHER): Payer: Medicare Other | Admitting: Physician Assistant

## 2018-03-15 ENCOUNTER — Encounter: Payer: Self-pay | Admitting: Physician Assistant

## 2018-03-15 VITALS — BP 124/68 | HR 56 | Temp 97.9°F | Resp 18 | Ht 69.0 in | Wt 167.8 lb

## 2018-03-15 DIAGNOSIS — G709 Myoneural disorder, unspecified: Secondary | ICD-10-CM | POA: Diagnosis not present

## 2018-03-15 DIAGNOSIS — M79661 Pain in right lower leg: Secondary | ICD-10-CM

## 2018-03-15 NOTE — Progress Notes (Signed)
I see the message.  Thank you for ruling him out for DVT.  Philis Fendt, MS, PA-C 5:20 PM, 03/15/2018

## 2018-03-15 NOTE — Progress Notes (Signed)
03/15/2018 1:54 PM   DOB: [birth year] / MRN: [id number]  SUBJECTIVE:  Dominic Butler is a 77 y.o. male presenting for pain about the posterior right calf.  Patient states this started last night and is worsening.  Reports pain about the calf when he walks as well.  Denies a history of DVT and PE.  Denies a history of GI bleed and recently had a colonoscopy which was within normal limits he was told.  He does smoke a pipe and tries not to inhale.  He takes no medication at this time.  Denies any recent trauma or surgery.  Denies any long car rides.  His right knee has been paining him for some time now and he has an appointment with Dr. Reynaldo Butler in orthopedics for further evaluation of this problem.  He has no history of stroke.  His has-bleed score is 1.02 which is optimal.   He has No Known Allergies.   He  has a past medical history of Allergy, Benign hematuria, Cancer ([place]), Elevated prostate specific antigen (PSA), and Neuromuscular disorder ([place]).    He  reports that he has been smoking pipe.  He has never used smokeless tobacco. He reports that he does not drink alcohol or use drugs. He  has no sexual activity history on file. The patient  has a past surgical history that includes Appendectomy; Insertion prostate radiation seed; Shoulder surgery; and Cholecystectomy.  His family history is not on file.  Review of Systems  Constitutional: Negative for chills and fever.  Respiratory: Negative for shortness of breath.   Cardiovascular: Negative for chest pain and leg swelling.  Musculoskeletal: Positive for myalgias.  Neurological: Negative for dizziness.    The problem list and medications were reviewed and updated by myself where necessary and exist elsewhere in the encounter.   OBJECTIVE:  BP 124/68   Butler (!) 56   Temp 97.9 F (36.6 C) (Oral)   Resp 18   Ht 5\' 9"  (1.753 m)   Wt 167 lb 12.8 oz (76.1 kg)   SpO2 94%   BMI 24.78 kg/m   Physical Exam  Constitutional: He is  oriented to person, place, and time. He appears well-developed. He does not appear ill.  Eyes: Pupils are equal, round, and reactive to light. Conjunctivae and EOM are normal.  Cardiovascular: Normal rate, regular rhythm, S1 normal, S2 normal, normal heart sounds, intact distal pulses and normal pulses. Exam reveals no gallop and no friction rub.  No murmur heard. Pulmonary/Chest: Effort normal. No stridor. No respiratory distress. He has no wheezes. He has no rales.  Abdominal: He exhibits no distension.  Musculoskeletal: Normal range of motion. He exhibits tenderness (Palpable mass at the posterior right calf associated with tenderness.  There is no erythema.  Calf circumference equal bilaterally at the ankle and the greatest circumference of the calf.). He exhibits no edema.  Neurological: He is alert and oriented to person, place, and time. No cranial nerve deficit. Coordination normal.  Skin: Skin is warm and dry. He is not diaphoretic.  Psychiatric: He has a normal mood and affect.  Nursing note and vitals reviewed.  Lab Results  Component Value Date   WBC 13.0 (H) 08/29/2017   HGB 14.5 08/29/2017   HCT 43.1 08/29/2017   MCV 88 08/29/2017   PLT 235 08/29/2017   Lab Results  Component Value Date   CREATININE 0.98 08/16/2017   BUN 14 08/16/2017   NA 143 08/16/2017   K 4.6 08/16/2017  CL 104 08/16/2017   CO2 29 08/16/2017   Lab Results  Component Value Date   ALT 13 08/16/2017   AST 19 08/16/2017   ALKPHOS 66 08/16/2017   BILITOT 0.2 08/16/2017     No results found for this or any previous visit (from the past 72 hour(s)).  No results found.  ASSESSMENT AND PLAN:  Dominic Butler was seen today for leg pain.  Diagnoses and all orders for this visit:  Right calf pain: Obviously concern for DVT.  I will screen the patient today with an ultrasound.  Patient denies any history of GI bleed or spontaneous bleeding.  Most recent platelet count within normal limits.  Fortunately  vascular ultrasound is negative.  There is a complex cystic mass in the area.  I like the patient to come back and see his primary Dr. Brigitte Butler or myself in about 1 week to recheck the leg. -     VAS [place] LOWER EXTREMITY VENOUS (DVT); Future    The patient is advised to call or return to clinic if he does not see an improvement in symptoms, or to seek the care of the closest emergency department if he worsens with the above plan.   Dominic Butler, MHS, PA-C [facility] 03/15/2018 1:54 PM

## 2018-03-15 NOTE — Progress Notes (Signed)
Preliminary notes--Right lower extremity venous duplex study completed. Negative for DVT. In the distal calf lateral region, a complex fluid collection, measuring 4.97x1.46x1.63cm.   Attempted to call provider multiple times but no answer. Will try on e-fax. Patient was instructed to contact his provider for further evaluation.   Hongying Landry Mellow (RDMS RVT) 03/15/18 4:46 PM

## 2018-03-15 NOTE — Telephone Encounter (Signed)
Pt c/o area size of nickle that is painful to touch. It is located on the upper calf area. Pt stated that he was outside doing yard work and last evening he touched it and noted bruising and pain when touched. He rates the pain 5/10.  Pt thinks it may be a bug bite but is not sure. He states that it itches. Asked him to have his wife draw and outline of bruise to see if it enlarges. Advised pt to go to [facility] if pain or bruising increases in size or if he becomes. Appt made to see Philis Fendt PA at 1:20 pm today. Reason for Disposition . [1] Swelling is painful to touch AND [2] no fever  Answer Assessment - Initial Assessment Questions 1. APPEARANCE of SWELLING: "What does it look like?" (e.g., lymph node, insect bite, mole)     Bruising and swelling 2. SIZE: "How large is the swelling?" (inches, cm or compare to coins)     1/2 inch 3. LOCATION: "Where is the swelling located?"     Below knee on calf 4. ONSET: "When did the swelling start?"     brusing and swelling within last 24 hours 5. PAIN: "Is it painful?" If so, ask: "How much?"     Yes when he touched it  5/10 6. ITCH: "Does it itch?" If so, ask: "How much?"     Yes  7. CAUSE: "What do you think caused the swelling?"     Bug bute 8. OTHER SYMPTOMS: "Do you have any other symptoms?" (e.g., fever)     No SOB, C/P, nickle sized bruising  Answer Assessment - Initial Assessment Questions 1. APPEARANCE of BRUISE: "Describe the bruise."      Nickle sized brusing with a pink middle area 2. SIZE: "How large is the bruise?"      nickle sized 3. NUMBER: "How many bruises are there?"      1 4. LOCATION: "Where is the bruise located?"      Upper calf  5. ONSET: "How long ago did the bruise occur?"      Less than 24 hours ago ? yesterday 6. CAUSE: "Tell me how it happened."     Pt was eating supper and felt area that was painful to touch 7. MEDICAL HISTORY: "Do you have any medical problems that can cause easy bruising or bleeding?"  (e.g., leukemia, liver disease, recent chemotherapy)     no 8. MEDICATIONS : "Do you take any medications which thin the blood such as: aspirin, heparin, ibuprofen (NSAIDS), Plavix, or Coumadin?"     no 9. OTHER SYMPTOMS: "Do you have any other symptoms?"  (e.g., weakness, dizziness, pain, fever, nosebleed, blood in urine/stool)     no 10. PREGNANCY: "Is there any chance you are pregnant?" "When was your last menstrual period?"       n/a  Protocols used: SKIN LUMP OR LOCALIZED SWELLING-A-AH, BRUISES-A-AH

## 2018-03-15 NOTE — Patient Instructions (Addendum)
You are scheduled for an ultrasound of your leg today at 4:00pm [facility]. Please arrive 15 minutes prior to your scheduled time and check in at Admitting located at the main entrance to the hospital.    IF you received an x-ray today, you will receive an invoice from [facility]. Please contact [facility] at [phone] with questions or concerns regarding your invoice.   IF you received labwork today, you will receive an invoice from [facility]. Please contact [facility] at [phone] with questions or concerns regarding your invoice.   Our billing staff will not be able to assist you with questions regarding bills from these companies.  You will be contacted with the lab results as soon as they are available. The fastest way to get your results is to activate your My Chart account. Instructions are located on the last page of this paperwork. If you have not heard from [place] regarding the results in 2 weeks, please contact this office.

## 2018-03-19 ENCOUNTER — Telehealth: Payer: Self-pay

## 2018-03-19 NOTE — Telephone Encounter (Signed)
Per plan from 03/15/18 office visit with Philis Fendt, provider would like patient to come back and see his primary Dr. Brigitte Pulse or Philis Fendt in about 1 week to recheck the leg.  No future appointments for patient.   Please call patient to schedule.

## 2018-04-18 DIAGNOSIS — B079 Viral wart, unspecified: Secondary | ICD-10-CM | POA: Diagnosis not present

## 2018-04-18 DIAGNOSIS — M2042 Other hammer toe(s) (acquired), left foot: Secondary | ICD-10-CM | POA: Diagnosis not present

## 2018-04-18 DIAGNOSIS — M79675 Pain in left toe(s): Secondary | ICD-10-CM | POA: Diagnosis not present

## 2018-05-02 DIAGNOSIS — B079 Viral wart, unspecified: Secondary | ICD-10-CM | POA: Diagnosis not present

## 2018-06-22 IMAGING — DX DG HAND COMPLETE 3+V*L*
3 series · 3 of 3 positions shown · non-contrast
Comparison: None.

CLINICAL DATA: Left hand pain after getting hit in the hand by a
drill.

EXAM:
LEFT HAND - COMPLETE 3+ VIEW

[hand pa]
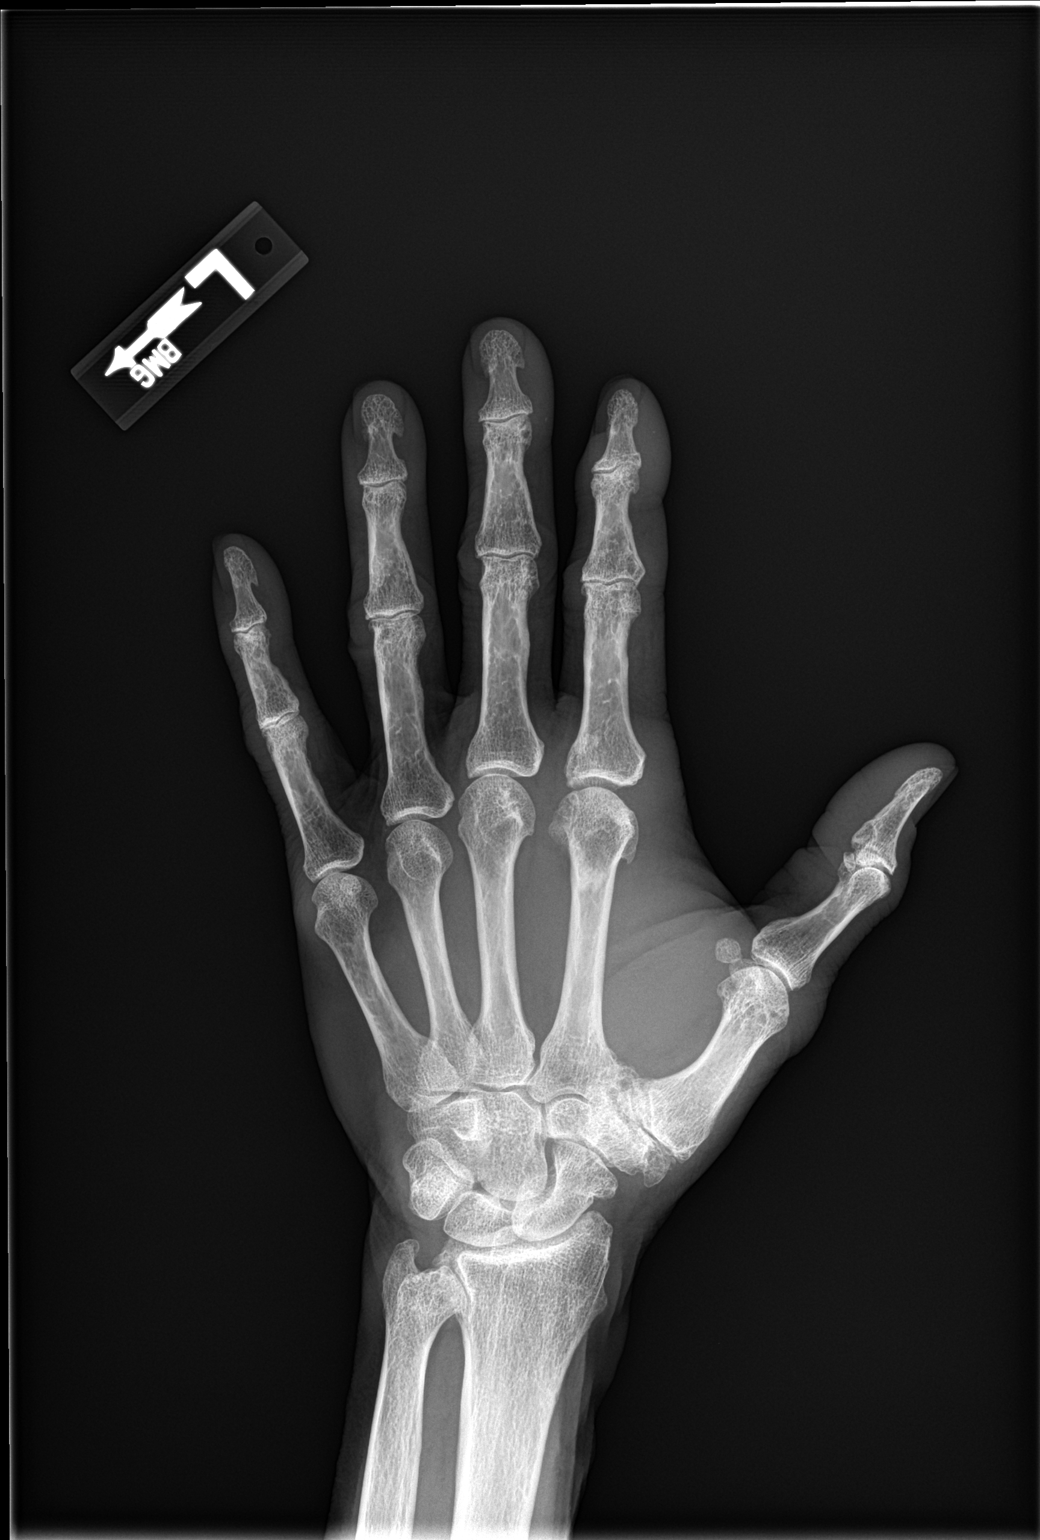

[hand obl]
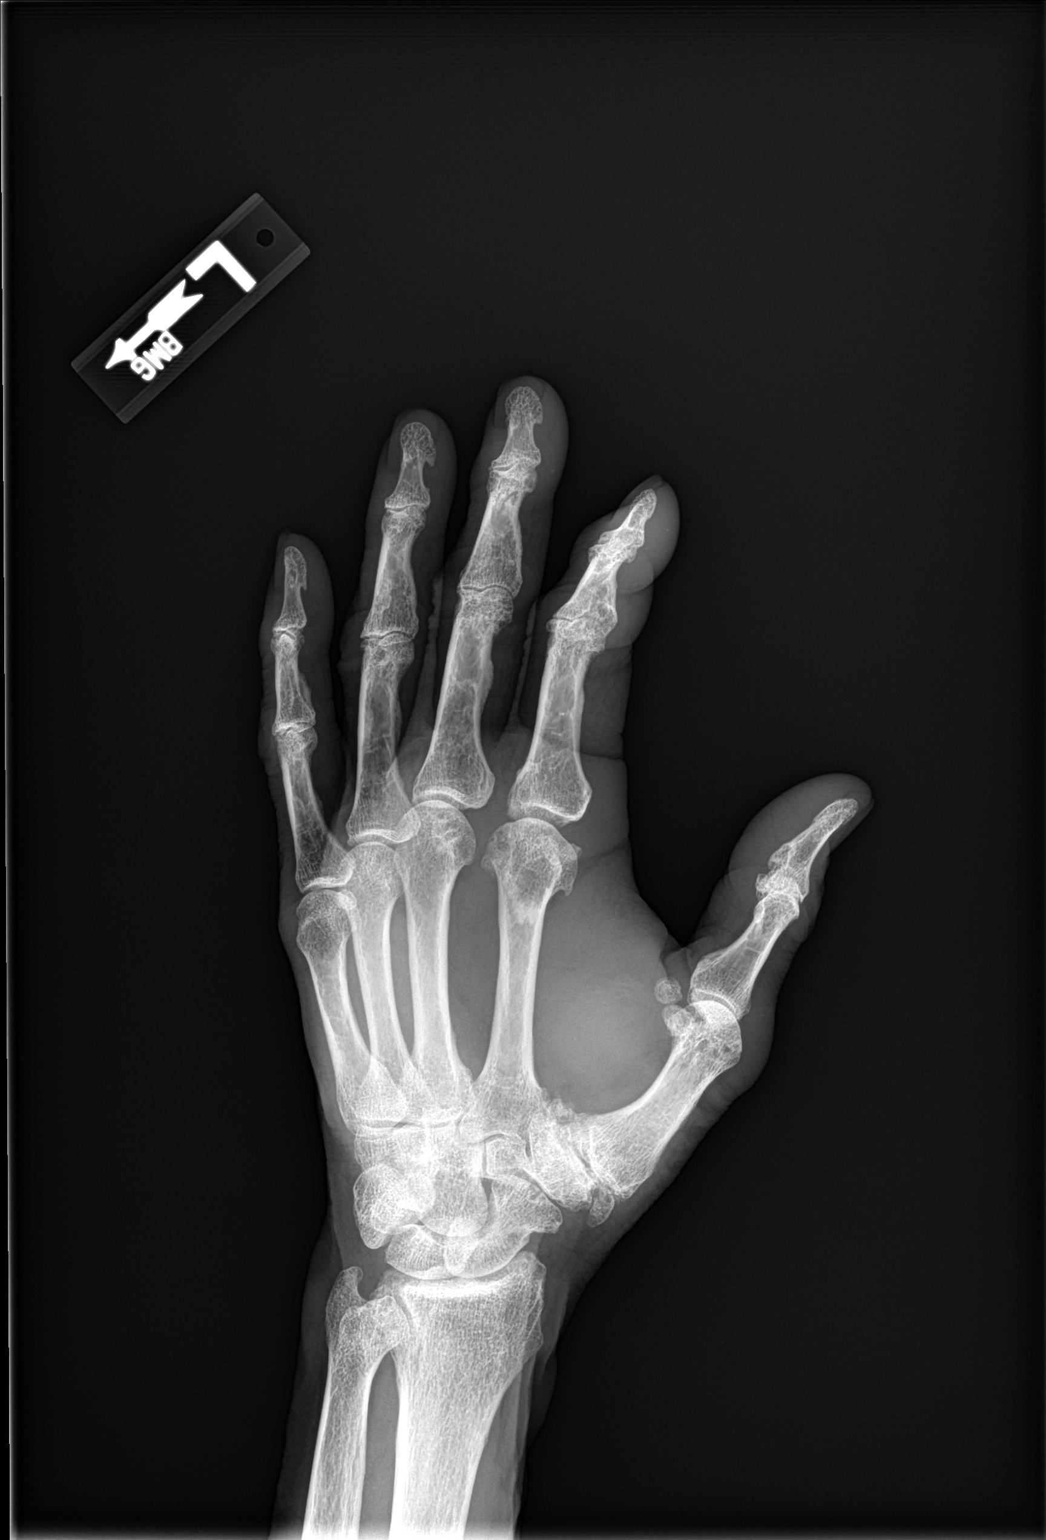

[hand lat]
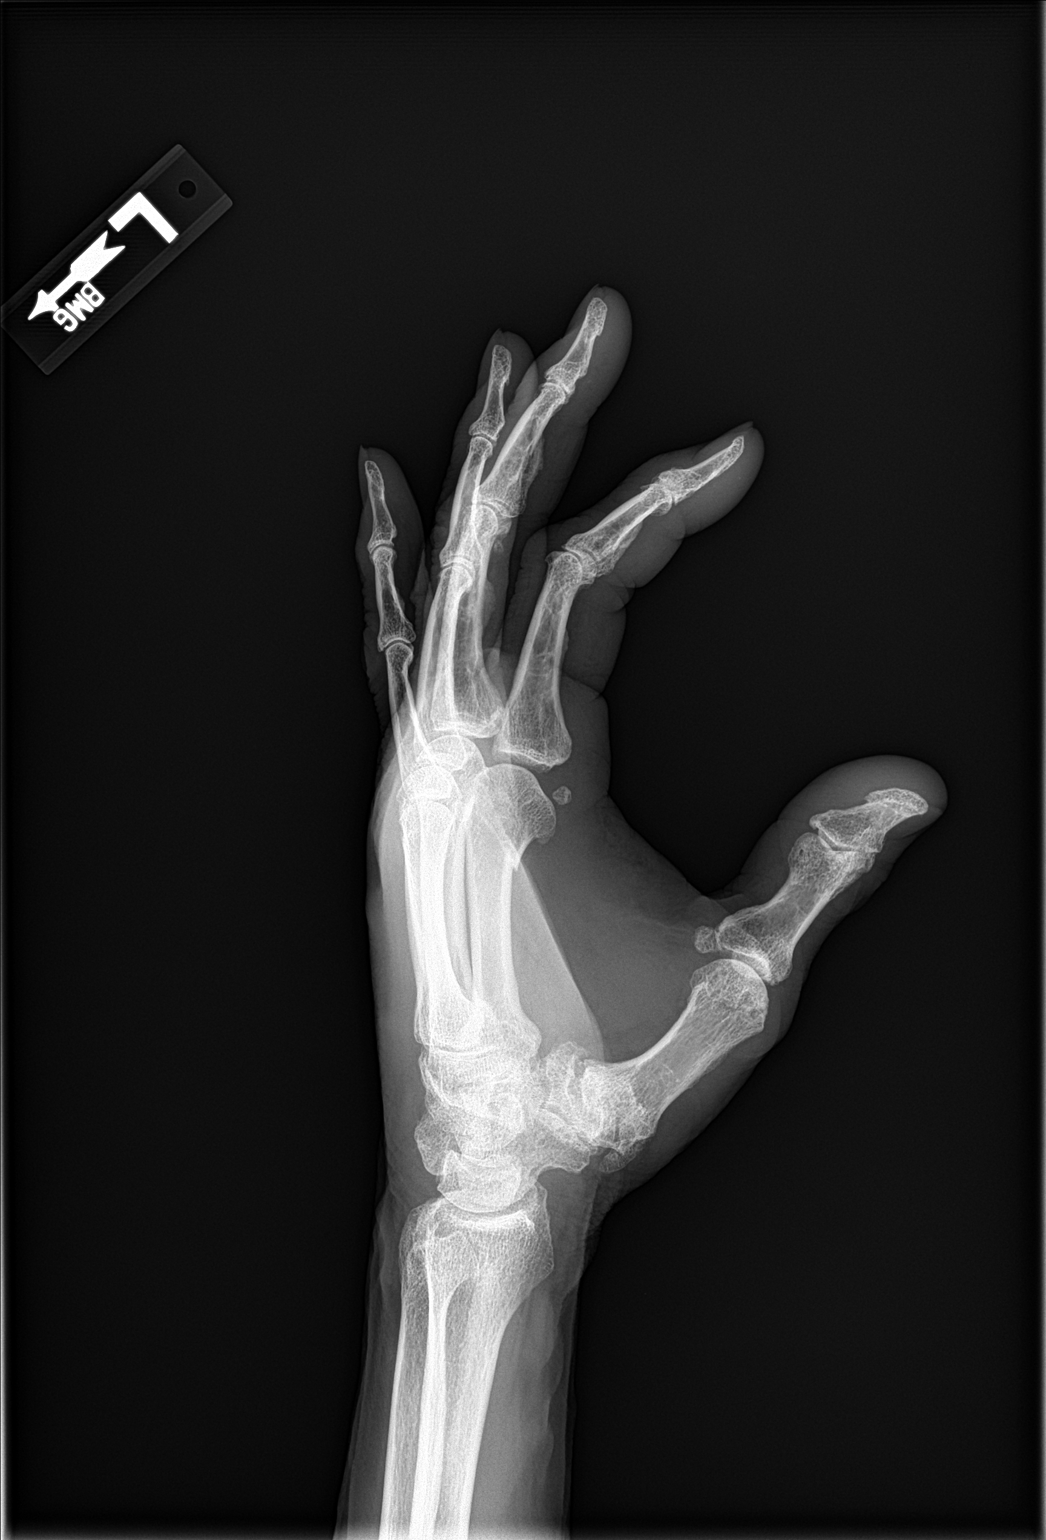

[3 of 3 positions shown; findings below may reference images not displayed]

FINDINGS: Degenerative changes at the articulation of the 1st metacarpal with
the trapezium and trapezoid. There are also degenerative changes
involving the 2nd and 3rd MCP joints, 2nd DIP and PIP joints and 4th
PIP joint. There are also degenerative changes involving the distal
radioulnar joint with mild shortening of the distal ulna relative to
the distal radius. No fracture or dislocation is seen.
IMPRESSION: 1. No fracture seen.
2. Multi joint degenerative changes.
3. Mild negative ulnar variance.

## 2018-07-18 DIAGNOSIS — M722 Plantar fascial fibromatosis: Secondary | ICD-10-CM | POA: Diagnosis not present

## 2018-07-18 DIAGNOSIS — M71571 Other bursitis, not elsewhere classified, right ankle and foot: Secondary | ICD-10-CM | POA: Diagnosis not present

## 2018-07-18 DIAGNOSIS — M7731 Calcaneal spur, right foot: Secondary | ICD-10-CM | POA: Diagnosis not present

## 2018-08-02 DIAGNOSIS — M71571 Other bursitis, not elsewhere classified, right ankle and foot: Secondary | ICD-10-CM | POA: Diagnosis not present

## 2018-08-02 DIAGNOSIS — M722 Plantar fascial fibromatosis: Secondary | ICD-10-CM | POA: Diagnosis not present

## 2018-08-15 DIAGNOSIS — M722 Plantar fascial fibromatosis: Secondary | ICD-10-CM | POA: Diagnosis not present

## 2018-08-15 DIAGNOSIS — Z23 Encounter for immunization: Secondary | ICD-10-CM | POA: Diagnosis not present

## 2018-10-22 DIAGNOSIS — H25011 Cortical age-related cataract, right eye: Secondary | ICD-10-CM | POA: Diagnosis not present

## 2018-10-22 DIAGNOSIS — H25013 Cortical age-related cataract, bilateral: Secondary | ICD-10-CM | POA: Diagnosis not present

## 2018-10-22 DIAGNOSIS — H2513 Age-related nuclear cataract, bilateral: Secondary | ICD-10-CM | POA: Diagnosis not present

## 2018-10-22 DIAGNOSIS — H35033 Hypertensive retinopathy, bilateral: Secondary | ICD-10-CM | POA: Diagnosis not present

## 2018-10-22 DIAGNOSIS — H11001 Unspecified pterygium of right eye: Secondary | ICD-10-CM | POA: Diagnosis not present

## 2018-10-22 DIAGNOSIS — H2511 Age-related nuclear cataract, right eye: Secondary | ICD-10-CM | POA: Diagnosis not present

## 2018-10-26 ENCOUNTER — Ambulatory Visit (INDEPENDENT_AMBULATORY_CARE_PROVIDER_SITE_OTHER): Payer: Medicare Other | Admitting: Podiatry

## 2018-10-26 ENCOUNTER — Ambulatory Visit (INDEPENDENT_AMBULATORY_CARE_PROVIDER_SITE_OTHER): Payer: Medicare Other

## 2018-10-26 ENCOUNTER — Encounter: Payer: Self-pay | Admitting: Podiatry

## 2018-10-26 ENCOUNTER — Other Ambulatory Visit: Payer: Self-pay | Admitting: Podiatry

## 2018-10-26 DIAGNOSIS — M722 Plantar fascial fibromatosis: Secondary | ICD-10-CM | POA: Diagnosis not present

## 2018-10-26 DIAGNOSIS — M79671 Pain in right foot: Secondary | ICD-10-CM | POA: Diagnosis not present

## 2018-10-26 MED ORDER — TRIAMCINOLONE ACETONIDE 10 MG/ML IJ SUSP
10.0000 mg | Freq: Once | INTRAMUSCULAR | Status: AC
  Administered 2018-10-26: 10 mg

## 2018-10-26 NOTE — Progress Notes (Signed)
Subjective:   Patient ID: Dominic Butler, male   DOB: 77 y.o.   MRN: [id number]   HPI Patient states he is developed a lot of pain in his right arch is having great difficulty bearing weight on his foot and also has pain in the outside of his foot and top of his foot.  States is been going on for a while and is worsened recently and patient does not smoke currently and likes to be active   Review of Systems  All other systems reviewed and are negative.       Objective:  Physical Exam  Constitutional: He appears well-developed and well-nourished.  Cardiovascular: Intact distal pulses.  Pulmonary/Chest: Effort normal.  Musculoskeletal: Normal range of motion.  Neurological: He is alert.  Skin: Skin is warm.  Nursing note and vitals reviewed.   Neurovascular status intact muscle strength was found to be adequate with mild reduction range of motion subtalar midtarsal joint.  Exquisite discomfort mid arch area right inflammation fluid also has pain dorsal foot right and into the Achilles tendon which appears to be compensatory is having trouble with weightbearing     Assessment:  Acute plantar fasciitis along with compensatory dorsal tendinitis Achilles tendinitis with all problems making weightbearing difficult     Plan:  H&P conditions reviewed and reviewed and treated from an aggressive conservative approach.  I injected the fascial band 3 mg Kenalog 5 Milgram Xylocaine advised on reduced activity and applied air fracture walker to completely immobilize and also applied some medicine around the posterior tibial tendon.  Patient will be checked back again next 2 to 3 weeks or earlier if needed  X-rays were indicated and did not show any signs of stress fracture or advanced arthritis

## 2018-12-11 DIAGNOSIS — H25011 Cortical age-related cataract, right eye: Secondary | ICD-10-CM | POA: Diagnosis not present

## 2018-12-11 DIAGNOSIS — H2511 Age-related nuclear cataract, right eye: Secondary | ICD-10-CM | POA: Diagnosis not present

## 2019-01-10 DIAGNOSIS — H2512 Age-related nuclear cataract, left eye: Secondary | ICD-10-CM | POA: Diagnosis not present

## 2019-01-10 DIAGNOSIS — H25012 Cortical age-related cataract, left eye: Secondary | ICD-10-CM | POA: Diagnosis not present

## 2019-02-01 DIAGNOSIS — H2512 Age-related nuclear cataract, left eye: Secondary | ICD-10-CM | POA: Diagnosis not present

## 2019-02-01 DIAGNOSIS — H25812 Combined forms of age-related cataract, left eye: Secondary | ICD-10-CM | POA: Diagnosis not present

## 2019-02-06 ENCOUNTER — Encounter: Payer: Self-pay | Admitting: Podiatry

## 2019-02-06 ENCOUNTER — Other Ambulatory Visit: Payer: Self-pay | Admitting: Podiatry

## 2019-02-06 ENCOUNTER — Ambulatory Visit (INDEPENDENT_AMBULATORY_CARE_PROVIDER_SITE_OTHER): Payer: Medicare Other

## 2019-02-06 ENCOUNTER — Ambulatory Visit (INDEPENDENT_AMBULATORY_CARE_PROVIDER_SITE_OTHER): Payer: Medicare Other | Admitting: Podiatry

## 2019-02-06 ENCOUNTER — Other Ambulatory Visit: Payer: Self-pay

## 2019-02-06 DIAGNOSIS — M722 Plantar fascial fibromatosis: Secondary | ICD-10-CM | POA: Diagnosis not present

## 2019-02-06 DIAGNOSIS — M79671 Pain in right foot: Secondary | ICD-10-CM | POA: Diagnosis not present

## 2019-02-06 DIAGNOSIS — R600 Localized edema: Secondary | ICD-10-CM | POA: Diagnosis not present

## 2019-02-10 NOTE — Progress Notes (Signed)
Subjective:   Patient ID: Dominic Butler, male   DOB: 78 y.o.   MRN: [id number]   HPI Patient presents stating he twisted his foot while golfing a day ago and states it is mostly on the outside and he is worried about the possibility of having a fracture.  Also complains of inflammation in the right heel   ROS      Objective:  Physical Exam  Neurovascular status intact negative Homans sign was noted with patient found to have quite a bit of edema with +2 pitting edema in the right foot and into the ankle.  It is sore when pressed and makes it hard to walk and he has lost motion of the subtalar joint which is probably due to the pain that he is in.  Moderate plantar fascial pain also noted     Assessment:  Appears to be acute process with edema of the right foot secondary to injury with possibility for bone injury     Plan:  H&P x-ray reviewed and today I went ahead and applied an Unna boot Ace wrap to take pressure off his foot and instructed him to leave it on 72 hours but to take it off earlier if necessary.  I then went ahead and discussed elevation and he will be seen back 1 week to reevaluate  X-ray indicates that there is spur but no indications of fracture of the lateral side of the right foot

## 2019-02-13 ENCOUNTER — Ambulatory Visit: Payer: Medicare Other | Admitting: Podiatry

## 2019-04-01 ENCOUNTER — Other Ambulatory Visit: Payer: Self-pay

## 2019-04-01 ENCOUNTER — Ambulatory Visit (INDEPENDENT_AMBULATORY_CARE_PROVIDER_SITE_OTHER): Payer: Medicare Other | Admitting: Podiatry

## 2019-04-01 ENCOUNTER — Encounter: Payer: Self-pay | Admitting: Podiatry

## 2019-04-01 VITALS — Temp 98.1°F

## 2019-04-01 DIAGNOSIS — R600 Localized edema: Secondary | ICD-10-CM | POA: Diagnosis not present

## 2019-04-01 DIAGNOSIS — M79671 Pain in right foot: Secondary | ICD-10-CM

## 2019-04-01 NOTE — Progress Notes (Signed)
Subjective:   Patient ID: Dominic Butler, male   DOB: 78 y.o.   MRN: [id number]   HPI Patient presents stating that the swelling is improved but still present and the Ace wrap has been helpful but cannot wear at all times.  States the pain seems to have resolved quite a bit   ROS      Objective:  Physical Exam  Neurovascular status intact with patient's right foot still showing mild edema on the lateral side but localized with negative Homans sign and +1 pitting edema noted upon compression.  Good digital perfusion noted     Assessment:  Appears to be a continued inflammatory condition with swelling that is improving but still present     Plan:  H&P conditions reviewed and at this point I have recommended continued compression and dispensed ankle compression stocking to help keep the swelling under control.  Patient will be seen back for [place] to recheck

## 2019-08-05 DIAGNOSIS — Z23 Encounter for immunization: Secondary | ICD-10-CM | POA: Diagnosis not present

## 2019-09-30 DIAGNOSIS — H35033 Hypertensive retinopathy, bilateral: Secondary | ICD-10-CM | POA: Diagnosis not present

## 2019-09-30 DIAGNOSIS — H04123 Dry eye syndrome of bilateral lacrimal glands: Secondary | ICD-10-CM | POA: Diagnosis not present

## 2019-09-30 DIAGNOSIS — Z961 Presence of intraocular lens: Secondary | ICD-10-CM | POA: Diagnosis not present

## 2019-09-30 DIAGNOSIS — H11151 Pinguecula, right eye: Secondary | ICD-10-CM | POA: Diagnosis not present

## 2019-12-27 ENCOUNTER — Ambulatory Visit: Payer: Medicare Other | Attending: Internal Medicine

## 2019-12-27 DIAGNOSIS — Z23 Encounter for immunization: Secondary | ICD-10-CM

## 2019-12-27 NOTE — Progress Notes (Signed)
   Covid-19 Vaccination Clinic  Name:  Dominic Butler    MRN: [id number] DOB: [birth year]  12/27/2019  Dominic Butler was observed post Covid-19 immunization for 15 minutes without incidence. He was provided with Vaccine Information Sheet and instruction to access the V-Safe system.   Dominic Butler was instructed to call 911 with any severe reactions post vaccine: Marland Kitchen Difficulty breathing  . Swelling of your face and throat  . A fast heartbeat  . A bad rash all over your body  . Dizziness and weakness    Immunizations Administered    Name Date Dose VIS Date Route   Pfizer COVID-19 Vaccine 12/27/2019  3:19 PM 0.3 mL 11/01/2019 Intramuscular   Manufacturer: [place]   Lot: [id number]   [place]: [id number]

## 2020-01-15 ENCOUNTER — Ambulatory Visit: Payer: Medicare Other

## 2020-01-16 ENCOUNTER — Ambulatory Visit (INDEPENDENT_AMBULATORY_CARE_PROVIDER_SITE_OTHER): Payer: Medicare Other | Admitting: Otolaryngology

## 2020-01-16 ENCOUNTER — Other Ambulatory Visit: Payer: Self-pay

## 2020-01-16 ENCOUNTER — Encounter (INDEPENDENT_AMBULATORY_CARE_PROVIDER_SITE_OTHER): Payer: Self-pay | Admitting: Otolaryngology

## 2020-01-16 VITALS — Temp 97.7°F

## 2020-01-16 DIAGNOSIS — R053 Chronic cough: Secondary | ICD-10-CM

## 2020-01-16 DIAGNOSIS — R05 Cough: Secondary | ICD-10-CM | POA: Diagnosis not present

## 2020-01-16 NOTE — Progress Notes (Signed)
HPI: Dominic Butler is a 79 y.o. male who presents for evaluation of chronic cough.  He coughs intermittently throughout the day.  Generally coughs up clear mucus.  He has had no hoarseness no sore throat.  He works outside a lot and [place] and feels like it might be related to allergies. He quit cigarettes years ago but smokes a pipe. He has not really tried any medications for this except for cough drops. Denies any trouble breathing through his nose. Does not take any allergy medication.  Past Medical History:  Diagnosis Date  . Allergy   . 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 Cancer [facility])    Prostate cancer status post radiation seeds  . Elevated prostate specific antigen (PSA)    has had seed implants and also seen by [facility]  . Neuromuscular disorder [facility])    Past Surgical History:  Procedure Laterality Date  . APPENDECTOMY    . CHOLECYSTECTOMY    . INSERTION PROSTATE RADIATION SEED    . SHOULDER SURGERY     Social History   Socioeconomic History  . Marital status: Divorced    Spouse name: Not on file  . Number of children: Not on file  . Years of education: Not on file  . Highest education level: Not on file  Occupational History  . Not on file  Tobacco Use  . Smoking status: Current Every Day Smoker    Types: Pipe  . Smokeless tobacco: Never Used  Substance and Sexual Activity  . Alcohol use: No  . Drug use: No  . Sexual activity: Not on file  Other Topics Concern  . Not on file  Social History Narrative  . Not on file   Social Determinants of Health   Financial Resource Strain:   . Difficulty of Paying Living Expenses: Not on file  Food Insecurity:   . Worried About Charity fundraiser in the Last Year: Not on file  . Ran Out of Food in the Last Year: Not on file  Transportation Needs:   . Lack of Transportation (Medical): Not on file  . Lack of  Transportation (Non-Medical): Not on file  Physical Activity:   . Days of Exercise per Week: Not on file  . Minutes of Exercise per Session: Not on file  Stress:   . Feeling of Stress : Not on file  Social Connections:   . Frequency of Communication with Friends and Family: Not on file  . Frequency of Social Gatherings with Friends and Family: Not on file  . Attends Religious Services: Not on file  . Active Member of Clubs or Organizations: Not on file  . Attends Archivist Meetings: Not on file  . Marital Status: Not on file   No family history on file. No Known Allergies Prior to Admission medications   Medication Sig Start Date End Date Taking? Authorizing Provider  naproxen (NAPROSYN) 500 MG tablet Take 500 mg by mouth 2 (two) times daily. 08/15/18  Yes [provider]     Positive ROS: Otherwise negative  All other systems have been reviewed and were otherwise negative with the exception of those mentioned in the HPI and as above.  Physical Exam: Constitutional: Alert, well-appearing, no acute distress Ears: External ears without lesions or tenderness. Ear canals are clear bilaterally with intact, clear TMs.  Nasal: External nose  without lesions. Septum deviated anteriorly to the right.  Moderate rhinitis with clear mucus discharge..  No signs of infection.  No mucopurulent discharge. Oral: Lips and gums without lesions. Tongue and palate mucosa without lesions. Posterior oropharynx clear.  Tonsils benign in appearance bilaterally. Indirect laryngoscopy revealed a clear base of tongue vallecula and epiglottis.  Vocal cords were clear bilaterally with normal vocal cord mobility.  AE folds piriform sinuses were clear.  He had moderate clear supraglottic mucus. Neck: No palpable adenopathy or masses.  No palpable adenopathy in the neck. Respiratory: Breathing comfortably  Skin: No facial/neck lesions or rash noted.  Procedures  Assessment: Clear upper airway  examination with no evidence of neoplasm or infection. Questionable allergic rhinitis contributing to cough. Mild rhinitis  Plan: Suggested trying antihistamine Claritin or Allegra.  Gave him a sample of Xyzal. Also gave him samples of Mucinex DM to try as well as Delsym.  Radene Journey, MD

## 2020-01-21 ENCOUNTER — Ambulatory Visit: Payer: Medicare Other | Attending: Internal Medicine

## 2020-01-21 DIAGNOSIS — Z23 Encounter for immunization: Secondary | ICD-10-CM | POA: Insufficient documentation

## 2020-01-21 NOTE — Progress Notes (Signed)
   Covid-19 Vaccination Clinic  Name:  Dominic Butler    MRN: [id number] DOB: 1941/01/31  01/21/2020  [name] was observed post Covid-19 immunization for 30 minutes based on pre-vaccination screening without incident. He was provided with Vaccine Information Sheet and instruction to access the V-Safe system.   [name] was instructed to call 911 with any severe reactions post vaccine: Marland Kitchen Difficulty breathing  . Swelling of face and throat  . A fast heartbeat  . A bad rash all over body  . Dizziness and weakness   Immunizations Administered    Name Date Dose VIS Date Route   Pfizer COVID-19 Vaccine 01/21/2020  3:12 PM 0.3 mL 11/01/2019 Intramuscular   Manufacturer: [place]   Lot: [id number]   [place]: [id number]

## 2020-06-29 ENCOUNTER — Ambulatory Visit
Admission: RE | Admit: 2020-06-29 | Discharge: 2020-06-29 | Disposition: A | Discharge status: Home / Self Care | Payer: Medicare Other | Source: Ambulatory Visit | Attending: Family Medicine | Admitting: Family Medicine

## 2020-06-29 ENCOUNTER — Other Ambulatory Visit: Payer: Self-pay | Admitting: Family Medicine

## 2020-06-29 DIAGNOSIS — Z720 Tobacco use: Secondary | ICD-10-CM

## 2020-06-29 DIAGNOSIS — R5383 Other fatigue: Secondary | ICD-10-CM

## 2020-06-29 DIAGNOSIS — R0989 Other specified symptoms and signs involving the circulatory and respiratory systems: Secondary | ICD-10-CM

## 2020-07-01 ENCOUNTER — Telehealth: Payer: Self-pay

## 2020-07-01 NOTE — Telephone Encounter (Signed)
NOTES ON FILE FROM DR EVA SHAW [phone] SENT REFERRAL TO SCHEDULING 

## 2020-07-02 ENCOUNTER — Telehealth: Payer: Self-pay | Admitting: Hematology

## 2020-07-02 NOTE — Telephone Encounter (Signed)
Received a new a new hem referral from Dr. Delman Cheadle for elevated wbc. Dominic Butler returned my call and has been scheduled to see Dominic Butler on 8/18 at 11am. Pt aware to arrive 15 minutes early.

## 2020-07-06 ENCOUNTER — Telehealth: Payer: Self-pay

## 2020-07-06 NOTE — Telephone Encounter (Signed)
NOTES ON FILE FROM DR EVA SHAW [phone] SENT REFERRAL TO SCHEDULING

## 2020-07-06 NOTE — Telephone Encounter (Signed)
[email or web address]

## 2020-07-08 ENCOUNTER — Inpatient Hospital Stay: Payer: Medicare Other

## 2020-07-08 ENCOUNTER — Inpatient Hospital Stay: Payer: Medicare Other | Attending: Hematology | Admitting: Hematology

## 2020-07-08 ENCOUNTER — Other Ambulatory Visit: Payer: Self-pay

## 2020-07-08 VITALS — BP 136/68 | HR 52 | Temp 96.4°F | Resp 18 | Ht 69.0 in | Wt 159.3 lb

## 2020-07-08 DIAGNOSIS — C9110 Chronic lymphocytic leukemia of B-cell type not having achieved remission: Secondary | ICD-10-CM | POA: Diagnosis present

## 2020-07-08 DIAGNOSIS — Z8546 Personal history of malignant neoplasm of prostate: Secondary | ICD-10-CM | POA: Insufficient documentation

## 2020-07-08 DIAGNOSIS — D72820 Lymphocytosis (symptomatic): Secondary | ICD-10-CM | POA: Diagnosis not present

## 2020-07-08 DIAGNOSIS — Z923 Personal history of irradiation: Secondary | ICD-10-CM | POA: Insufficient documentation

## 2020-07-08 LAB — CBC WITH DIFFERENTIAL/PLATELET
Abs Immature Granulocytes: 0.03 10*3/uL (ref 0.00–0.07)
Basophils Absolute: 0.1 10*3/uL (ref 0.0–0.1)
Basophils Relative: 1 %
Eosinophils Absolute: 0.3 10*3/uL (ref 0.0–0.5)
Eosinophils Relative: 2 %
HCT: 44.2 % (ref 39.0–52.0)
Hemoglobin: 14.5 g/dL (ref 13.0–17.0)
Immature Granulocytes: 0 %
Lymphocytes Relative: 62 %
Lymphs Abs: 8.4 10*3/uL — ABNORMAL HIGH (ref 0.7–4.0)
MCH: 29.8 pg (ref 26.0–34.0)
MCHC: 32.8 g/dL (ref 30.0–36.0)
MCV: 90.9 fL (ref 80.0–100.0)
Monocytes Absolute: 0.7 10*3/uL (ref 0.1–1.0)
Monocytes Relative: 5 %
Neutro Abs: 4 10*3/uL (ref 1.7–7.7)
Neutrophils Relative %: 30 %
Platelets: 256 10*3/uL (ref 150–400)
RBC: 4.86 MIL/uL (ref 4.22–5.81)
RDW: 13.8 % (ref 11.5–15.5)
WBC: 13.3 10*3/uL — ABNORMAL HIGH (ref 4.0–10.5)
nRBC: 0 % (ref 0.0–0.2)

## 2020-07-08 LAB — CMP (CANCER CENTER ONLY)
ALT: 9 U/L (ref 0–44)
AST: 14 U/L — ABNORMAL LOW (ref 15–41)
Albumin: 3.4 g/dL — ABNORMAL LOW (ref 3.5–5.0)
Alkaline Phosphatase: 83 U/L (ref 38–126)
Anion gap: 8 (ref 5–15)
BUN: 19 mg/dL (ref 8–23)
CO2: 25 mmol/L (ref 22–32)
Calcium: 9.3 mg/dL (ref 8.9–10.3)
Chloride: 106 mmol/L (ref 98–111)
Creatinine: 0.94 mg/dL (ref 0.61–1.24)
GFR, Est AFR Am: >60 mL/min (ref >60)
GFR, Estimated: >60 mL/min (ref >60)
Glucose, Bld: 97 mg/dL (ref 70–99)
Potassium: 4.9 mmol/L (ref 3.5–5.1)
Sodium: 139 mmol/L (ref 135–145)
Total Bilirubin: 0.2 mg/dL — ABNORMAL LOW (ref 0.3–1.2)
Total Protein: 6.3 g/dL — ABNORMAL LOW (ref 6.5–8.1)

## 2020-07-08 LAB — HEPATITIS C ANTIBODY: HCV Ab: NONREACTIVE

## 2020-07-08 LAB — HEPATITIS B CORE ANTIBODY, TOTAL: Hep B Core Total Ab: NONREACTIVE

## 2020-07-08 LAB — HEPATITIS B SURFACE ANTIGEN: Hepatitis B Surface Ag: NONREACTIVE

## 2020-07-08 LAB — LACTATE DEHYDROGENASE: LDH: 155 U/L (ref 98–192)

## 2020-07-08 NOTE — Progress Notes (Signed)
HEMATOLOGY/ONCOLOGY CONSULTATION NOTE  Date of Service: 07/08/2020  Patient Care Team: Shawnee Knapp, MD as PCP - General (Family Medicine) Monna Fam, MD as Consulting Physician (Ophthalmology) Wonda Horner, MD as Consulting Physician (Gastroenterology)  CHIEF COMPLAINTS/PURPOSE OF CONSULTATION:  Elevated WBC/Concern for CLL  HISTORY OF PRESENTING ILLNESS:   Dominic Butler is a wonderful 79 y.o. male who has been referred to [place] by Dr. Delman Cheadle for evaluation and management of leukocytosis. The pt reports that he is doing well overall.   The pt reports that he had Prostate Cancer in 2005-2006 that was treated with radiation seeds. Pt has allergies and Plantar fasciitis. Dr. Brigitte Pulse is concerned that he has a heart murmur. Pt has a follow-up with her in September. He has had his Appendix and Gallbladder removed. Pt denies any seizures, strokes, or neurological disorders.   He denies any significant changes or new symptoms in the last six months.  Pt lost about 5 lbs over the last few months, but attributes this to the work he does around the house and limited diet.   Most recent lab results (02/03/2020) of CBC w/diff is as follows: all values are WNL except for WBC at 14.9K, RDW at 38.1, Baso Abs at 0.3K, Lymphs Abs at 10.9K, Neutro Rel at 20, Mono Rel at 73, Baso Rel at 2.  On review of systems, pt denies fevers, chills, night sweats, unexpected weight loss, rash, fatigue, new lumps/bumps and any other symptoms.   On PMHx the pt reports Prostate cancer, Allergy, Appendectomy, Cholecystectomy, Insertion Prostate Radiation Seed. Plantar fasciitis. On Social Hx the pt reports that he smokes a pipe daily. He does not drink much alcohol and denies any other drug use. He is a retired Web designer. On Family Hx the pt reports his sister had Lung cancer and his father had Liver cancer.    MEDICAL HISTORY:  Past Medical History:  Diagnosis Date  . Allergy   .  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 Cancer [facility])    Prostate cancer status post radiation seeds  . Elevated prostate specific antigen (PSA)    has had seed implants and also seen by [facility]  . Neuromuscular disorder [facility])     SURGICAL HISTORY: Past Surgical History:  Procedure Laterality Date  . APPENDECTOMY    . CHOLECYSTECTOMY    . INSERTION PROSTATE RADIATION SEED    . SHOULDER SURGERY      SOCIAL HISTORY: Social History   Socioeconomic History  . Marital status: Divorced    Spouse name: Not on file  . Number of children: Not on file  . Years of education: Not on file  . Highest education level: Not on file  Occupational History  . Not on file  Tobacco Use  . Smoking status: Current Every Day Smoker    Types: Pipe  . Smokeless tobacco: Never Used  Substance and Sexual Activity  . Alcohol use: No  . Drug use: No  . Sexual activity: Not on file  Other Topics Concern  . Not on file  Social History Narrative  . Not on file   Social Determinants of Health   Financial Resource Strain:   . Difficulty of Paying Living Expenses:   Food Insecurity:   . Worried About Charity fundraiser in the Last Year:   . [employer]  in the Last Year:   Transportation Needs:   . Film/video editor (Medical):   Marland Kitchen Lack of Transportation (Non-Medical):   Physical Activity:   . Days of Exercise per Week:   . Minutes of Exercise per Session:   Stress:   . Feeling of Stress :   Social Connections:   . Frequency of Communication with Friends and Family:   . Frequency of Social Gatherings with Friends and Family:   . Attends Religious Services:   . Active Member of Clubs or Organizations:   . Attends Archivist Meetings:   Marland Kitchen Marital Status:   Intimate Partner Violence:   . Fear of Current or Ex-Partner:   . Emotionally Abused:   Marland Kitchen Physically Abused:   . Sexually Abused:      FAMILY HISTORY: No family history on file.  ALLERGIES:  has No Known Allergies.  MEDICATIONS:  Current Outpatient Medications  Medication Sig Dispense Refill  . naproxen (NAPROSYN) 500 MG tablet Take 500 mg by mouth 2 (two) times daily. (Patient not taking: Reported on 07/08/2020)  3   No current facility-administered medications for this visit.    REVIEW OF SYSTEMS:    10 Point review of Systems was done is negative except as noted above.  PHYSICAL EXAMINATION: ECOG PERFORMANCE STATUS: 0 - Asymptomatic  . Vitals:   07/08/20 1219  BP: 136/68  Pulse: (!) 52  Resp: 18  Temp: (!) 96.4 F (35.8 C)  SpO2: 97%   Filed Weights   07/08/20 1219  Weight: 159 lb 4.8 oz (72.3 kg)   .Body mass index is 23.52 kg/m.  GENERAL:alert, in no acute distress and comfortable SKIN: no acute rashes, no significant lesions EYES: conjunctiva are pink and non-injected, sclera anicteric OROPHARYNX: MMM, no exudates, no oropharyngeal erythema or ulceration NECK: supple, no JVD LYMPH:  no palpable lymphadenopathy in the cervical or axillary regions. Couple, small inguinal lymph nodes b/l.  LUNGS: clear to auscultation b/l with normal respiratory effort HEART: regular rate & rhythm ABDOMEN:  normoactive bowel sounds , non tender, not distended. Extremity: no pedal edema PSYCH: alert & oriented x 3 with fluent speech NEURO: no focal motor/sensory deficits  LABORATORY DATA:  I have reviewed the data as listed  . CBC Latest Ref Rng & Units 07/08/2020 08/29/2017 08/16/2017  WBC 4.0 - 10.5 K/uL 13.3(H) 13.0(H) 13.4(H)  Hemoglobin 13.0 - 17.0 g/dL 14.5 14.5 14.5  Hematocrit 39 - 52 % 44.2 43.1 43.1  Platelets 150 - 400 K/uL 256 235 227    . CMP Latest Ref Rng & Units 07/08/2020 08/16/2017 02/20/2015  Glucose 70 - 99 mg/dL 97 97 92  BUN 8 - 23 mg/dL 19 14 20   Creatinine 0.61 - 1.24 mg/dL 0.94 0.98 0.99  Sodium 135 - 145 mmol/L 139 143 140  Potassium 3.5 - 5.1 mmol/L 4.9 4.6 4.3  Chloride  98 - 111 mmol/L 106 104 104  CO2 22 - 32 mmol/L 25 29 25   Calcium 8.9 - 10.3 mg/dL 9.3 9.0 8.9  Total Protein 6.5 - 8.1 g/dL 6.3(L) 6.1 6.4  Total Bilirubin 0.3 - 1.2 mg/dL 0.2(L) 0.2 0.6  Alkaline Phos 38 - 126 U/L 83 66 57  AST 15 - 41 U/L 14(L) 19 19  ALT 0 - 44 U/L 9 13 13    Component     Latest Ref Rng & Units 03/10/2016 08/16/2017 08/29/2017  WBC     3.4 - 10.8 x10E3/uL 10.2 13.4 (H) 13.0 (H)    RADIOGRAPHIC  STUDIES: I have personally reviewed the radiological images as listed and agreed with the findings in the report. DG Chest 2 View  Result Date: 06/29/2020 CLINICAL DATA:  Chronic fatigue.  Bibasilar crackles. EXAM: CHEST - 2 VIEW COMPARISON:  Chest x-ray dated April 24, 2014. FINDINGS: The heart size and mediastinal contours are within normal limits. Normal pulmonary vascularity. The lungs remain mildly hyperinflated. No focal consolidation, pleural effusion, or pneumothorax. No acute osseous abnormality. IMPRESSION: 1. No acute cardiopulmonary disease. 2. COPD. Electronically Signed   By: Titus Dubin M.D.   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  -Advised pt that his chronic lymphocytosis could be caused by a reactive process or a primary bone marrow disorder.  -Advised pt that one of the more common bone marrow disorders charachterized by chronic lymphocytosis is CLL. -Advised pt that CLL is typically indolent and is monitored with labs and clinic visits.  -Would not move to treat CLL unless disease is causing cytopenias, bulky disease, threatened end organs, or constiutional symptoms. -Advised pt that pipe smoking could cause chronic inflammation, which could elevate WBC.  -Will get labs today - no indication for scans or biopsies at this time.   -Will see back in 2-3 weeks    FOLLOW UP: Labs today RTC with Dr Irene Limbo in 2-3 weeks  . Orders Placed This Encounter  Procedures  . CBC with Differential/Platelet    Standing Status:   Future    Number of Occurrences:   1    Standing Expiration Date:   07/08/2021  . CMP ([place] only)    Standing Status:   Future    Number of Occurrences:   1    Standing Expiration Date:   07/08/2021  . Lactate dehydrogenase    Standing Status:   Future    Number of Occurrences:   1    Standing Expiration Date:   07/08/2021  . Flow Cytometry    Patient with persistent and gradually progressive lymphocytosis concerning for CLL    Standing Status:   Future    Number of Occurrences:   1    Standing Expiration Date:   07/08/2021  . FISH, CLL Prognostic Panel    Standing Status:   Future    Number of Occurrences:   1    Standing Expiration Date:   07/08/2021  . Hepatitis C antibody    Standing Status:   Future    Number of Occurrences:   1    Standing Expiration Date:   07/08/2021  . Hepatitis B core antibody, total    Standing Status:   Future    Number of Occurrences:   1    Standing Expiration Date:   07/08/2021  . Hepatitis B surface antigen    Standing Status:   Future    Number of Occurrences:   1    Standing Expiration Date:   07/08/2021     All of the patients questions were answered with apparent satisfaction. The patient knows to call the clinic with any problems, questions or concerns.  I spent 48mins counseling the patient face to face. The total time spent in the appointment was 60 minutes and more than 50% was on counseling and direct patient cares.    Sullivan Lone MD  MS AAHIVMS [facility] Hematology/Oncology Physician [facility]  (Office):       [phone] (Work cell):  [phone] (Fax):           [phone]  07/08/2020 8:57 PM  I, Yevette Edwards, am acting as a scribe for Dr. Sullivan Lone.   .I have reviewed the above documentation for accuracy  and completeness, and I agree with the above. Brunetta Genera MD   ADDENDUM  Flow cytometry consistent with CLL

## 2020-07-09 ENCOUNTER — Telehealth: Payer: Self-pay | Admitting: Hematology

## 2020-07-09 LAB — SURGICAL PATHOLOGY

## 2020-07-09 NOTE — Telephone Encounter (Signed)
Scheduled appointment per 8/18 los. Patient is aware of appointment date and time.  

## 2020-07-10 LAB — FLOW CYTOMETRY

## 2020-07-15 LAB — FISH,CLL PROGNOSTIC PANEL

## 2020-07-23 NOTE — Progress Notes (Signed)
Cardiology Office Note:    Date:  07/24/2020   ID:  Dominic Butler, Dominic Butler, 1942, MRN [id number]  PCP:  Shawnee Knapp, MD  Cardiologist:  No primary care provider on file.  Electrophysiologist:  None   Referring MD: Shawnee Knapp, MD   Chief Complaint  Patient presents with  . Heart Murmur    History of Present Illness:    Dominic Butler is a 79 y.o. 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 Brigitte Butler for evaluation of abnormal EKG and heart murmur.  He denies any chest pain or dyspnea.  Reports he does yard work every day and he denies any exertional symptoms.  Denies any lightheadedness or syncope recently.  Does report he had an episode of lightheadedness while driving 3 years ago, but none since.  He denies any palpitations or lower extremity edema.  Smokes pipe every day.  No known cardiac history in his immediate family.  BP Readings from Last 3 Encounters:  07/24/20 (!) 150/70  07/08/20 136/68  03/15/18 124/68      Past Medical History:  Diagnosis Date  . Allergy   . 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 Cancer [facility])    Prostate cancer status post radiation seeds  . Elevated prostate specific antigen (PSA)    has had seed implants and also seen by [facility]  . Neuromuscular disorder [facility])     Past Surgical History:  Procedure Laterality Date  . APPENDECTOMY    . CHOLECYSTECTOMY    . INSERTION PROSTATE RADIATION SEED    . SHOULDER SURGERY      Current Medications: No outpatient medications have been marked as taking for the 07/24/20 encounter (Office Visit) with Donato Heinz, MD.     Allergies:   Patient has no known allergies.   Social History   Socioeconomic History  . Marital status: Divorced    Spouse name: Not on file  . Number of children: Not on file  . Years of education: Not on file  . Highest education level: Not on file    Occupational History  . Not on file  Tobacco Use  . Smoking status: Current Every Day Smoker    Types: Pipe  . Smokeless tobacco: Never Used  Substance and Sexual Activity  . Alcohol use: No  . Drug use: No  . Sexual activity: Not on file  Other Topics Concern  . Not on file  Social History Narrative  . Not on file   Social Determinants of Health   Financial Resource Strain:   . Difficulty of Paying Living Expenses: Not on file  Food Insecurity:   . Worried About Charity fundraiser in the Last Year: Not on file  . Ran Out of Food in the Last Year: Not on file  Transportation Needs:   . Lack of Transportation (Medical): Not on file  . Lack of Transportation (Non-Medical): Not on file  Physical Activity:   . Days of Exercise per Week: Not on file  . Minutes of Exercise per Session: Not on file  Stress:   . Feeling of Stress : Not on file  Social Connections:   . Frequency of Communication with Friends and Family: Not on file  . Frequency of Social Gatherings with Friends and Family: Not on file  . Attends Religious Services: Not on file  .  Active Member of Clubs or Organizations: Not on file  . Attends Archivist Meetings: Not on file  . Marital Status: Not on file     Family History: The patient's family history is not on file.  ROS:   Please see the history of present illness.     All other systems reviewed and are negative.  EKGs/Labs/Other Studies Reviewed:    The following studies were reviewed today:   EKG:  EKG is  ordered today.  The ekg ordered today demonstrates sinus bradycardia, rate 48, right bundle branch block, left anterior fascicular block  Recent Labs: 07/08/2020: ALT 9; BUN 19; Creatinine 0.94; Hemoglobin 14.5; Platelets 256; Potassium 4.9; Sodium 139  Recent Lipid Panel    Component Value Date/Time   CHOL 173 08/16/2017 1548   TRIG 46 08/16/2017 1548   HDL 76 08/16/2017 1548   CHOLHDL 2.3 08/16/2017 1548   CHOLHDL 1.9  02/20/2015 1021   VLDL 9 02/20/2015 1021   LDLCALC 88 08/16/2017 1548    Physical Exam:    VS:  BP (!) 150/70   Butler (!) 48   Ht 5\' 9"  (1.753 m)   Wt 167 lb (75.8 kg)   SpO2 95%   BMI 24.66 kg/m     Wt Readings from Last 3 Encounters:  07/24/20 167 lb (75.8 kg)  07/08/20 159 lb 4.8 oz (72.3 kg)  03/15/18 167 lb 12.8 oz (76.1 kg)     GEN: Well nourished, well developed in no acute distress HEENT: Normal NECK: No JVD; No carotid bruits LYMPHATICS: No lymphadenopathy CARDIAC: braydcardic, regular, 3 out of 6 systolic murmur loudest at RUSB RESPIRATORY:  Clear to auscultation without rales, wheezing or rhonchi  ABDOMEN: Soft, non-tender, non-distended MUSCULOSKELETAL:  No edema; No deformity  SKIN: Warm and dry NEUROLOGIC:  Alert and oriented x 3 PSYCHIATRIC:  Normal affect   ASSESSMENT:    1. Heart murmur   2. Bradycardia   3. Elevated BP without diagnosis of hypertension    PLAN:     Heart murmur: Systolic ejection murmur on exam.  Will evaluate further with echocardiogram  Bradycardia: Sinus bradycardia to 40s.  Reports remote episode of lightheadedness, but none recently.  Will evaluate further with Zio patch x7 days  Elevated BP: BP is elevated in clinic today but no history of hypertension.  Asked patient to monitor BP daily for next 2 weeks and call with results  RTC in 3 months  Medication Adjustments/Labs and Tests Ordered: Current medicines are reviewed at length with the patient today.  Concerns regarding medicines are outlined above.  Orders Placed This Encounter  Procedures  . LONG TERM MONITOR (3-14 DAYS)  . EKG 12-Lead  . ECHOCARDIOGRAM COMPLETE   No orders of the defined types were placed in this encounter.   Patient Instructions  Medication Instructions:  Your physician recommends that you continue on your current medications as directed. Please refer to the Current Medication list given to you today.  Testing/Procedures: Your physician  has requested that you have an echocardiogram. Echocardiography is a painless test that uses sound waves to create images of your heart. It provides your doctor with information about the size and shape of your heart and how well your heart's chambers and valves are working. This procedure takes approximately one hour. There are no restrictions for this procedure. This will be done at our [facility] location:  [place]  physician has requested you wear your ZIO patch monitor 7 days.   This is a single patch monitor.  [employer] supplies one patch monitor per enrollment.  Additional stickers are not available.   Please do not apply patch if you will be having a Nuclear Stress Test, Echocardiogram, Cardiac CT, MRI, or Chest Xray during the time frame you would be wearing the monitor. The patch cannot be worn during these tests.  You cannot remove and re-apply the ZIO XT patch monitor.   Your ZIO patch monitor will be sent USPS Priority mail from [facility] directly to your home address. The monitor may also be mailed to a PO BOX if home delivery is not available.   It may take 3-5 days to receive your monitor after you have been enrolled.   Once you have received you monitor, please review enclosed instructions.  Your monitor has already been registered assigning a specific monitor serial # to you.   Applying the monitor   Shave hair from upper left chest.   Hold abrader disc by orange tab.  Rub abrader in 40 strokes over left upper chest as indicated in your monitor instructions.   Clean area with 4 enclosed alcohol pads .  Use all pads to assure are is cleaned thoroughly.  Let dry.   Apply patch as indicated in monitor instructions.  Patch will be place under collarbone on left side of chest with arrow pointing upward.   Rub patch adhesive wings for 2 minutes.Remove white label marked "1".  Remove white label  marked "2".  Rub patch adhesive wings for 2 additional minutes.   While looking in a mirror, press and release button in center of patch.  A small green light will flash 3-4 times .  This will be your only indicator the monitor has been turned on.     Do not shower for the first 24 hours.  You may shower after the first 24 hours.   Press button if you feel a symptom. You will hear a small click.  Record Date, Time and Symptom in the Patient Log Book.   When you are ready to remove patch, follow instructions on last 2 pages of Patient Log Book.  Stick patch monitor onto last page of Patient Log Book.   Place Patient Log Book in [place] box.  Use locking tab on box and tape box closed securely.  The [employer] and [employer] has [employer] on it.  Please place in mailbox as soon as possible.  Your physician should have your test results approximately 7 days after the monitor has been mailed back to [facility].   Call [place] at [phone] if you have questions regarding your ZIO XT patch monitor.  Call them immediately if you see an orange light blinking on your monitor.   If your monitor falls off in less than 4 days contact our Monitor department at [phone].  If your monitor becomes loose or falls off after 4 days call [employer] at [phone] for suggestions on securing your monitor.    Follow-Up: At [facility], you and your health needs are our priority.  As part of our continuing mission to provide you with exceptional heart care, we have created designated Provider Care Teams.  These Care Teams include your primary Cardiologist (physician) and Advanced Practice Providers (APPs -  Physician Assistants and Nurse Practitioners) who all work together to provide you with the care you need, when you need it.  We recommend signing up for the patient portal called "MyChart".  Sign up information is provided on this After Visit Summary.  MyChart is used to connect  with patients for Virtual Visits (Telemedicine).  Patients are able to view lab/test results, encounter notes, upcoming appointments, etc.  Non-urgent messages can be sent to your provider as well.   To learn more about what you can do with MyChart, go to [email or web address].    Your next appointment:   3 month(s)  The format for your next appointment:   In Person  Provider:   Oswaldo Milian, MD        Signed, Donato Heinz, MD  07/24/2020 5:33 PM    [place]

## 2020-07-24 ENCOUNTER — Other Ambulatory Visit: Payer: Self-pay

## 2020-07-24 ENCOUNTER — Telehealth: Payer: Self-pay | Admitting: Radiology

## 2020-07-24 ENCOUNTER — Ambulatory Visit (INDEPENDENT_AMBULATORY_CARE_PROVIDER_SITE_OTHER): Payer: Medicare Other | Admitting: Cardiology

## 2020-07-24 ENCOUNTER — Encounter: Payer: Self-pay | Admitting: Cardiology

## 2020-07-24 VITALS — BP 150/70 | HR 48 | Ht 69.0 in | Wt 167.0 lb

## 2020-07-24 DIAGNOSIS — R011 Cardiac murmur, unspecified: Secondary | ICD-10-CM | POA: Diagnosis not present

## 2020-07-24 DIAGNOSIS — R001 Bradycardia, unspecified: Secondary | ICD-10-CM | POA: Diagnosis not present

## 2020-07-24 DIAGNOSIS — R030 Elevated blood-pressure reading, without diagnosis of hypertension: Secondary | ICD-10-CM

## 2020-07-24 NOTE — Telephone Encounter (Signed)
Enrolled patient for a 7 Day Zio XT monitor to be mailed to patients home  

## 2020-07-24 NOTE — Patient Instructions (Signed)
Medication Instructions:  °Your physician recommends that you continue on your current medications as directed. Please refer to the Current Medication list given to you today. ° °Testing/Procedures: °Your physician has requested that you have an echocardiogram. Echocardiography is a painless test that uses sound waves to create images of your heart. It provides your doctor with information about the size and shape of your heart and how well your heart’s chambers and valves are working. This procedure takes approximately one hour. There are no restrictions for this procedure. °This will be done at our [address] location:  [address] ° °ZIO XT- Long Term Monitor Instructions  ° °Your physician has requested you wear your ZIO patch monitor 7 days.  ° °This is a single patch monitor.  [employer] supplies one patch monitor per enrollment.  Additional stickers are not available. °  °Please do not apply patch if you will be having a Nuclear Stress Test, Echocardiogram, Cardiac CT, MRI, or Chest Xray during the time frame you would be wearing the monitor. The patch cannot be worn during these tests.  You cannot remove and re-apply the ZIO XT patch monitor. °  °Your ZIO patch monitor will be sent USPS Priority mail from [employer] directly to your home address. The monitor may also be mailed to a PO BOX if home delivery is not available.   It may take 3-5 days to receive your monitor after you have been enrolled. °  °Once you have received you monitor, please review enclosed instructions.  Your monitor has already been registered assigning a specific monitor serial # to you. °  °Applying the monitor  ° °Shave hair from upper left chest. °  °Hold abrader disc by orange tab.  Rub abrader in 40 strokes over left upper chest as indicated in your monitor instructions. °  °Clean area with 4 enclosed alcohol pads .  Use all pads to assure are is cleaned thoroughly.  Let dry.  ° °Apply patch as  indicated in monitor instructions.  Patch will be place under collarbone on left side of chest with arrow pointing upward. °  °Rub patch adhesive wings for 2 minutes.Remove white label marked "1".  Remove white label marked "2".  Rub patch adhesive wings for 2 additional minutes. °  °While looking in a mirror, press and release button in center of patch.  A small green light will flash 3-4 times .  This will be your only indicator the monitor has been turned on. °    °Do not shower for the first 24 hours.  You may shower after the first 24 hours. °  °Press button if you feel a symptom. You will hear a small click.  Record Date, Time and Symptom in the Patient Log Book. °  °When you are ready to remove patch, follow instructions on last 2 pages of Patient Log Book.  Stick patch monitor onto last page of Patient Log Book. °  °Place Patient Log Book in [place] box.  Use locking tab on box and tape box closed securely.  The [employer] box has prepaid postage on it.  Please place in mailbox as soon as possible.  Your physician should have your test results approximately 7 days after the monitor has been mailed back to [employer]. °  °Call [employer] Customer Care at [phone] if you have questions regarding your ZIO XT patch monitor.  Call them immediately if you see an orange light blinking on your   monitor.   If your monitor falls off in less than 4 days contact our Monitor department at [phone].  If your monitor becomes loose or falls off after 4 days call [employer] at [phone] for suggestions on securing your monitor.    Follow-Up: At [facility], you and your health needs are our priority.  As part of our continuing mission to provide you with exceptional heart care, we have created designated Provider Care Teams.  These Care Teams include your primary Cardiologist (physician) and Advanced Practice Providers (APPs -  Physician Assistants and Nurse Practitioners) who all work  together to provide you with the care you need, when you need it.  We recommend signing up for the patient portal called "MyChart".  Sign up information is provided on this After Visit Summary.  MyChart is used to connect with patients for Virtual Visits (Telemedicine).  Patients are able to view lab/test results, encounter notes, upcoming appointments, etc.  Non-urgent messages can be sent to your provider as well.   To learn more about what you can do with MyChart, go to [email or web address].    Your next appointment:   3 month(s)  The format for your next appointment:   In Person  Provider:   Christopher Schumann, MD    

## 2020-07-29 ENCOUNTER — Inpatient Hospital Stay: Payer: Medicare Other | Attending: Hematology | Admitting: Hematology

## 2020-07-29 ENCOUNTER — Other Ambulatory Visit: Payer: Self-pay

## 2020-07-29 VITALS — BP 150/72 | HR 57 | Temp 96.5°F | Resp 18 | Ht 69.0 in | Wt 160.6 lb

## 2020-07-29 DIAGNOSIS — Z8546 Personal history of malignant neoplasm of prostate: Secondary | ICD-10-CM | POA: Insufficient documentation

## 2020-07-29 DIAGNOSIS — F17290 Nicotine dependence, other tobacco product, uncomplicated: Secondary | ICD-10-CM | POA: Diagnosis not present

## 2020-07-29 DIAGNOSIS — C9110 Chronic lymphocytic leukemia of B-cell type not having achieved remission: Secondary | ICD-10-CM | POA: Insufficient documentation

## 2020-07-29 NOTE — Patient Instructions (Signed)
-  Prevnar was given on 08/16/2017. Please check with PCP to see if pt has received the Pneumovax in the last five years. If not, it is strongly recommend at this time.

## 2020-07-29 NOTE — Progress Notes (Signed)
HEMATOLOGY/ONCOLOGY CONSULTATION NOTE  Date of Service: 07/29/2020  Patient Care Team: Shawnee Knapp, MD as PCP - General (Family Medicine) Monna Fam, MD as Consulting Physician (Ophthalmology) Wonda Horner, MD as Consulting Physician (Gastroenterology)  CHIEF COMPLAINTS/PURPOSE OF CONSULTATION:  F/u for CLL HISTORY OF PRESENTING ILLNESS:   Dominic Butler is a wonderful 79 y.o. male who has been referred to [place] by Dr. Delman Cheadle for evaluation and management of leukocytosis. The pt reports that he is doing well overall.   The pt reports that he had Prostate Cancer in 2005-2006 that was treated with radiation seeds. Pt has allergies and Plantar fasciitis. Dr. Brigitte Pulse is concerned that he has a heart murmur. Pt has a follow-up with her in September. He has had his Appendix and Gallbladder removed. Pt denies any seizures, strokes, or neurological disorders.   He denies any significant changes or new symptoms in the last six months.  Pt lost about 5 lbs over the last few months, but attributes this to the work he does around the house and limited diet.   Most recent lab results (02/03/2020) of CBC w/diff is as follows: all values are WNL except for WBC at 14.9K, RDW at 38.1, Baso Abs at 0.3K, Lymphs Abs at 10.9K, Neutro Rel at 20, Mono Rel at 73, Baso Rel at 2.  On review of systems, pt denies fevers, chills, night sweats, unexpected weight loss, rash, fatigue, new lumps/bumps and any other symptoms.   On PMHx the pt reports Prostate cancer, Allergy, Appendectomy, Cholecystectomy, Insertion Prostate Radiation Seed. Plantar fasciitis. On Social Hx the pt reports that he smokes a pipe daily. He does not drink much alcohol and denies any other drug use. He is a retired Web designer. On Family Hx the pt reports his sister had Lung cancer and his father had Liver cancer.    INTERVAL HISTORY: Dominic Butler is a wonderful 79 y.o. 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 We are joined today by his daughter. The patient's last visit with [place] was on 07/08/2020. The pt reports that he is doing well overall.  The pt reports that he has felt well and had no new symptoms in the interim. Pt had a boil or cyst on his buttocks. Pt drained the cyst and it has been healing well.   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 07/08/2020 Hepatitis B Surface Ag is "Non Reactive" 07/08/2020 Hep B Core Total Ab is "Non Reactive" 07/08/2020 HCV Ab is "Non Reactive" 07/08/2020 LDH at 155  On review of systems, pt denies abdominal pain, fevers, chills, night sweats, unexpected weight loss and any other symptoms.   MEDICAL HISTORY:  Past Medical History:  Diagnosis Date  . Allergy   . 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 Cancer [facility])    Prostate cancer status post radiation seeds  . Elevated prostate specific antigen (PSA)  has had seed implants and also seen by [facility]  . Neuromuscular disorder [facility])     SURGICAL HISTORY: Past Surgical History:  Procedure Laterality Date  . APPENDECTOMY    . CHOLECYSTECTOMY    . INSERTION PROSTATE RADIATION SEED    . SHOULDER SURGERY      SOCIAL HISTORY: Social History   Socioeconomic History  . Marital status: Divorced    Spouse name: Not on file  . Number of children: Not on file  . Years of education: Not on file  . Highest education level: Not  on file  Occupational History  . Not on file  Tobacco Use  . Smoking status: Current Every Day Smoker    Types: Pipe  . Smokeless tobacco: Never Used  Substance and Sexual Activity  . Alcohol use: No  . Drug use: No  . Sexual activity: Not on file  Other Topics Concern  . Not on file  Social History Narrative  . Not on file   Social Determinants of Health   Financial Resource Strain:   . Difficulty of Paying Living Expenses: Not on file  Food Insecurity:   . Worried About Charity fundraiser in the Last Year: Not on file  . Ran Out of Food in the Last Year: Not on file  Transportation Needs:   . Lack of Transportation (Medical): Not on file  . Lack of Transportation (Non-Medical): Not on file  Physical Activity:   . Days of Exercise per Week: Not on file  . Minutes of Exercise per Session: Not on file  Stress:   . Feeling of Stress : Not on file  Social Connections:   . Frequency of Communication with Friends and Family: Not on file  . Frequency of Social Gatherings with Friends and Family: Not on file  . Attends Religious Services: Not on file  . Active Member of Clubs or Organizations: Not on file  . Attends Archivist Meetings: Not on file  . Marital Status: Not on file  Intimate Partner Violence:   . Fear of Current or Ex-Partner: Not on file  . Emotionally Abused: Not on file  . Physically Abused: Not on file  . Sexually Abused: Not on file    FAMILY HISTORY: No family history on file.  ALLERGIES:  has No Known Allergies.  MEDICATIONS:  No current outpatient medications on file.   No current facility-administered medications for this visit.    REVIEW OF SYSTEMS:   A 10+ POINT REVIEW OF SYSTEMS WAS OBTAINED including neurology, dermatology, psychiatry, cardiac, respiratory, lymph, extremities, GI, GU, Musculoskeletal, constitutional, breasts, reproductive, HEENT.  All pertinent positives are noted in the HPI.  All others are negative.    PHYSICAL EXAMINATION: ECOG PERFORMANCE STATUS: 0 - Asymptomatic  . Vitals:   07/29/20 1602  BP: (!) 150/72  Pulse: (!) 57  Resp: 18  Temp: (!) 96.5 F (35.8 C)  SpO2: 97%   Filed Weights   07/29/20 1602  Weight: 160 lb 9.6 oz (72.8 kg)   .Body mass index is 23.72 kg/m.   GENERAL:alert, in no acute distress and comfortable SKIN: no acute rashes, no significant lesions EYES: conjunctiva are pink and non-injected, sclera anicteric OROPHARYNX: MMM, no exudates, no oropharyngeal erythema or ulceration NECK: supple, no JVD LYMPH:  no palpable lymphadenopathy in the cervical or axillary regions. Couple, small inguinal lymph nodes b/l.  LUNGS: clear to auscultation b/l with normal respiratory effort HEART: regular rate & rhythm ABDOMEN:  normoactive bowel  sounds , non tender, not distended. No palpable hepatosplenomegaly.  Extremity: no pedal edema PSYCH: alert & oriented x 3 with fluent speech NEURO: no focal motor/sensory deficits  LABORATORY DATA:  I have reviewed the data as listed  . CBC Latest Ref Rng & Units 07/08/2020 08/29/2017 08/16/2017  WBC 4.0 - 10.5 K/uL 13.3(H) 13.0(H) 13.4(H)  Hemoglobin 13.0 - 17.0 g/dL 14.5 14.5 14.5  Hematocrit 39 - 52 % 44.2 43.1 43.1  Platelets 150 - 400 K/uL 256 235 227    . CMP Latest Ref Rng & Units 07/08/2020 08/16/2017 02/20/2015  Glucose 70 - 99 mg/dL 97 97 92  BUN 8 - 23 mg/dL 19 14 20   Creatinine 0.61 - 1.24 mg/dL 0.94 0.98 0.99  Sodium 135 - 145 mmol/L 139 143 140  Potassium 3.5 - 5.1 mmol/L 4.9 4.6 4.3  Chloride 98 - 111 mmol/L 106 104 104  CO2 22 - 32 mmol/L 25 29 25   Calcium 8.9 - 10.3 mg/dL 9.3 9.0 8.9  Total Protein 6.5 - 8.1 g/dL 6.3(L) 6.1 6.4  Total Bilirubin 0.3 - 1.2 mg/dL 0.2(L) 0.2 0.6  Alkaline Phos 38 - 126 U/L 83 66 57  AST 15 - 41 U/L 14(L) 19 19  ALT 0 - 44 U/L 9 13 13    07/08/2020 FISH-CLL Prognosis Panel:   07/08/2020 Flow Pathology Report [id number]): Surgical Pathology  CASE: [id number]   PATIENT: Dominic Butler  Flow Pathology Report      Clinical history: persistent lymphocytosis      DIAGNOSIS:   -Monoclonal B-cell population identified  -See comment   COMMENT:   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     Component     Latest Ref Rng & Units 03/10/2016 08/16/2017 08/29/2017  WBC     3.4 - 10.8 x10E3/uL 10.2 13.4 (H) 13.0 (H)    RADIOGRAPHIC STUDIES: I have personally reviewed the radiological images as listed and agreed with the findings in the report. No results found.  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 -Advised pt that he has better-than-average risk genetics.  -Advised pt that CLL increases the risk of non-Melanoma skin cancers. This risk is even higher with a 13 q deletion.  -Advised pt that we move to treat CLL if: creating cytopenias, bulky disease, threatened end organs, or constitutional symptoms. -Advised pt that there is no clinical data to show better health outcomes when treating CLL earlier than necessary.  -Advised pt that there is a <5% chance of Richter's transformation from CLL to [place]. -Discussed CDC guidelines regarding Rich booster. Recommend pt receive as soon as possible.  -Recommend pt receive annual flu vaccine when available. Wait two weeks between immunizations if possible.  -Recommend pt f/u with PCP to see if he has received the Pneumovax. Pt received the Prevnar in  07/2017. -Recommend annual screenings with a Dermatologist. -Will see back in 4 months with labs   FOLLOW UP: RTC with Dr Irene Limbo with labs in 4 months   The total time spent in the appt was  30 minutes and more than 50% was on counseling and direct patient cares.  All of the patient's questions were answered with apparent satisfaction. The patient knows to call the clinic with any problems, questions or concerns.    Sullivan Lone MD [place] AAHIVMS [facility] Hematology/Oncology Physician [facility]  (Office):       [phone] (Work cell):  [phone] (Fax):           [phone]  07/29/2020 4:57 PM  I, Yevette Edwards, am acting as a scribe for Dr. Sullivan Lone.   .I have reviewed the above documentation for accuracy and completeness, and I agree with the above. Brunetta Genera MD

## 2020-07-30 ENCOUNTER — Other Ambulatory Visit (INDEPENDENT_AMBULATORY_CARE_PROVIDER_SITE_OTHER): Payer: Medicare Other

## 2020-07-30 DIAGNOSIS — R001 Bradycardia, unspecified: Secondary | ICD-10-CM

## 2020-07-31 ENCOUNTER — Other Ambulatory Visit (HOSPITAL_COMMUNITY): Payer: Medicare Other

## 2020-08-05 MED ORDER — AMLODIPINE BESYLATE 5 MG PO TABS: 5.0000 mg | ORAL_TABLET | Freq: Every day | ORAL | 3 refills | Status: DC

## 2020-08-05 NOTE — Telephone Encounter (Signed)
BP intermittently elevated, recommend starting amlodipine 5 mg daily

## 2020-08-11 ENCOUNTER — Other Ambulatory Visit: Payer: Self-pay

## 2020-08-11 ENCOUNTER — Ambulatory Visit (HOSPITAL_COMMUNITY): Payer: Medicare Other | Attending: Cardiovascular Disease

## 2020-08-11 DIAGNOSIS — R011 Cardiac murmur, unspecified: Secondary | ICD-10-CM

## 2020-08-11 LAB — ECHOCARDIOGRAM COMPLETE
AR max vel: 1.33 cm2
AV Area VTI: 1.68 cm2
AV Area mean vel: 1.42 cm2
AV Mean grad: 9.5 mmHg
AV Peak grad: 19.9 mmHg
Ao pk vel: 2.23 m/s
Area-P 1/2: 2.48 cm2
S' Lateral: 2.3 cm

## 2020-10-22 NOTE — Progress Notes (Signed)
Cardiology Office Note:    Date:  10/23/2020   ID:  Parish, [place]ose [birth year], MRN [id number]  PCP:  Shawnee Knapp, MD  Cardiologist:  No primary care provider on file.  Electrophysiologist:  None   Referring MD: Shawnee Knapp, MD   Chief Complaint  Patient presents with  . Hypertension    History of Present Illness:    Dominic Butler is a 79 y.o. 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  He was referred by Dr. Brigitte Pulse for evaluation of abnormal EKG and heart murmur, initially seen on 07/24/2020.  He denies any chest pain or dyspnea.  Reports he does yard work every day and he denies any exertional symptoms.  Denies any lightheadedness or syncope recently.  Does report he had an episode of lightheadedness while driving 3 years ago, but none since.  He denies any palpitations or lower extremity edema.  Smokes pipe every day.  Mother died of MI at age 30.  Echocardiogram on 08/11/2020 showed normal biventricular function, moderate LVH, grade 2 diastolic dysfunction, mild aortic stenosis.  Zio patch x7 days showed 12 episodes of SVT, longest lasting 13 seconds.  Since last clinic visit, he reports that he has been doing well.  He denies any chest pain or dyspnea.  Denies any lightheadedness, syncope, or lower extremity edema.  Does report occasional palpitations but lasts less than 5 seconds.   BP Readings from Last 3 Encounters:  10/23/20 132/80  07/29/20 (!) 150/72  07/24/20 (!) 150/70      Past Medical History:  Diagnosis Date  . Allergy   . 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 Cancer [facility])    Prostate cancer status post radiation seeds  . Elevated prostate specific antigen (PSA)    has had seed implants and also seen by [facility]  . Neuromuscular disorder [facility])     Past Surgical History:  Procedure Laterality Date  . APPENDECTOMY    .  CHOLECYSTECTOMY    . INSERTION PROSTATE RADIATION SEED    . SHOULDER SURGERY      Current Medications: Current Meds  Medication Sig  . amLODipine (NORVASC) 10 MG tablet Take 1 tablet (10 mg total) by mouth daily.  . [DISCONTINUED] amLODipine (NORVASC) 5 MG tablet Take 1 tablet (5 mg total) by mouth daily.     Allergies:   Patient has no known allergies.   Social History   Socioeconomic History  . Marital status: Divorced    Spouse name: Not on file  . Number of children: Not on file  . Years of education: Not on file  . Highest education level: Not on file  Occupational History  . Not on file  Tobacco Use  . Smoking status: Current Every Day Smoker    Types: Pipe  . Smokeless tobacco: Never Used  Substance and Sexual Activity  . Alcohol use: No  . Drug use: No  . Sexual activity: Not on file  Other Topics Concern  . Not on file  Social History Narrative  . Not on file   Social Determinants of Health   Financial Resource Strain:   . Difficulty of Paying Living Expenses: Not on file  Food Insecurity:   . Worried About Charity fundraiser in the Last Year: Not on file  . Ran Out of Food in the  Last Year: Not on file  Transportation Needs:   . Lack of Transportation (Medical): Not on file  . Lack of Transportation (Non-Medical): Not on file  Physical Activity:   . Days of Exercise per Week: Not on file  . Minutes of Exercise per Session: Not on file  Stress:   . Feeling of Stress : Not on file  Social Connections:   . Frequency of Communication with Friends and Family: Not on file  . Frequency of Social Gatherings with Friends and Family: Not on file  . Attends Religious Services: Not on file  . Active Member of Clubs or Organizations: Not on file  . Attends Archivist Meetings: Not on file  . Marital Status: Not on file     Family History: The patient's family history includes Heart attack in his mother.  ROS:   Please see the history of present  illness.     All other systems reviewed and are negative.  EKGs/Labs/Other Studies Reviewed:    The following studies were reviewed today:   EKG:  EKG is ordered today.  The ekg ordered today demonstrates sinus bradycardia, rate 48, right bundle branch block, left anterior fascicular block  Recent Labs: 07/08/2020: ALT 9; BUN 19; Creatinine 0.94; Hemoglobin 14.5; Platelets 256; Potassium 4.9; Sodium 139  Recent Lipid Panel    Component Value Date/Time   CHOL 173 08/16/2017 1548   TRIG 46 08/16/2017 1548   HDL 76 08/16/2017 1548   CHOLHDL 2.3 08/16/2017 1548   CHOLHDL 1.9 02/20/2015 1021   VLDL 9 02/20/2015 1021   LDLCALC 88 08/16/2017 1548    Physical Exam:    VS:  BP 132/80   Pulse 65   Ht 5\' 9"  (1.753 m)   Wt 162 lb 9.6 oz (73.8 kg)   SpO2 93%   BMI 24.01 kg/m     Wt Readings from Last 3 Encounters:  10/23/20 162 lb 9.6 oz (73.8 kg)  07/29/20 160 lb 9.6 oz (72.8 kg)  07/24/20 167 lb (75.8 kg)     GEN: Well nourished, well developed in no acute distress HEENT: Normal NECK: No JVD; No carotid bruits LYMPHATICS: No lymphadenopathy CARDIAC: braydcardic, regular, 3 out of 6 systolic murmur loudest at RUSB RESPIRATORY:  Clear to auscultation without rales, wheezing or rhonchi  ABDOMEN: Soft, non-tender, non-distended MUSCULOSKELETAL:  No edema; No deformity  SKIN: Warm and dry NEUROLOGIC:  Alert and oriented x 3 PSYCHIATRIC:  Normal affect   ASSESSMENT:    1. Essential hypertension   2. Screening, lipid   3. Aortic valve stenosis, etiology of cardiac valve disease unspecified   4. Bradycardia   5. SVT (supraventricular tachycardia) (HCC)    PLAN:     Aortic stenosis: Mild on echocardiogram 08/11/2020.  Will monitor  Bradycardia: Sinus bradycardia to 40s.  Reports remote episode of lightheadedness, but none recently.  No bradyarrhythmias on Zio patch  SVT: Zio patch x7 days on 08/13/2020 showed 12 episodes of SVT, longest lasting 13 seconds.  No symptoms  reported during episodes.  Hypertension: On amlodipine 5 mg daily.  BP has been mildly elevated at times on his BP log.  Will increase amlodipine to 10 mg daily.  Asked patient to check BP twice daily for next 2 weeks and call with results.  Tobacco use: continues to smoke pipe, not interested in quitting.  Counseled on the risks of tobacco use and cessation strongly recommended  Lipid screening: no recent lipid panel, will check  RTC in 3  months  Medication Adjustments/Labs and Tests Ordered: Current medicines are reviewed at length with the patient today.  Concerns regarding medicines are outlined above.  Orders Placed This Encounter  Procedures  . Lipid panel   Meds ordered this encounter  Medications  . amLODipine (NORVASC) 10 MG tablet    Sig: Take 1 tablet (10 mg total) by mouth daily.    Dispense:  90 tablet    Refill:  3    Patient Instructions  Medication Instructions:  INCREASE- Amlodipine 10 mg by mouth daily  *If you need a refill on your cardiac medications before your next appointment, please call your pharmacy*   Lab Work: Fasting Lipid  If you have labs (blood work) drawn today and your tests are completely normal, you will receive your results only by: Marland Kitchen MyChart Message (if you have MyChart) OR . A paper copy in the mail If you have any lab test that is abnormal or we need to change your treatment, we will call you to review the results.   Testing/Procedures: None Ordered   Follow-Up: At [facility], you and your health needs are our priority.  As part of our continuing mission to provide you with exceptional heart care, we have created designated Provider Care Teams.  These Care Teams include your primary Cardiologist (physician) and Advanced Practice Providers (APPs -  Physician Assistants and Nurse Practitioners) who all work together to provide you with the care you need, when you need it.  We recommend signing up for the patient portal called  "MyChart".  Sign up information is provided on this After Visit Summary.  MyChart is used to connect with patients for Virtual Visits (Telemedicine).  Patients are able to view lab/test results, encounter notes, upcoming appointments, etc.  Non-urgent messages can be sent to your provider as well.   To learn more about what you can do with MyChart, go to [email or web address].    Your next appointment:   1 year(s)  The format for your next appointment:   In Person  Provider:   You may see Oswaldo Milian, MD or one of the following Advanced Practice Providers on your designated Care Team:    Rosaria Ferries, PA-C  Jory Sims, DNP, ANP    Other Instructions Check blood pressure twice a day for the next 2 weeks     Signed, Donato Heinz, MD  10/23/2020 6:40 PM    [place]

## 2020-10-23 ENCOUNTER — Ambulatory Visit (INDEPENDENT_AMBULATORY_CARE_PROVIDER_SITE_OTHER): Payer: Medicare Other | Admitting: Cardiology

## 2020-10-23 ENCOUNTER — Other Ambulatory Visit: Payer: Self-pay

## 2020-10-23 ENCOUNTER — Encounter: Payer: Self-pay | Admitting: Cardiology

## 2020-10-23 VITALS — BP 132/80 | HR 65 | Ht 69.0 in | Wt 162.6 lb

## 2020-10-23 DIAGNOSIS — I350 Nonrheumatic aortic (valve) stenosis: Secondary | ICD-10-CM | POA: Diagnosis not present

## 2020-10-23 DIAGNOSIS — Z13220 Encounter for screening for lipoid disorders: Secondary | ICD-10-CM

## 2020-10-23 DIAGNOSIS — R001 Bradycardia, unspecified: Secondary | ICD-10-CM | POA: Diagnosis not present

## 2020-10-23 DIAGNOSIS — I471 Supraventricular tachycardia: Secondary | ICD-10-CM

## 2020-10-23 DIAGNOSIS — I10 Essential (primary) hypertension: Secondary | ICD-10-CM | POA: Diagnosis not present

## 2020-10-23 MED ORDER — AMLODIPINE BESYLATE 10 MG PO TABS: 10.0000 mg | ORAL_TABLET | Freq: Every day | ORAL | 3 refills | Status: DC

## 2020-10-23 NOTE — Patient Instructions (Signed)
Medication Instructions:  INCREASE- Amlodipine 10 mg by mouth daily  *If you need a refill on your cardiac medications before your next appointment, please call your pharmacy*   Lab Work: Fasting Lipid  If you have labs (blood work) drawn today and your tests are completely normal, you will receive your results only by: Marland Kitchen MyChart Message (if you have MyChart) OR . A paper copy in the mail If you have any lab test that is abnormal or we need to change your treatment, we will call you to review the results.   Testing/Procedures: None Ordered   Follow-Up: At [facility], you and your health needs are our priority.  As part of our continuing mission to provide you with exceptional heart care, we have created designated Provider Care Teams.  These Care Teams include your primary Cardiologist (physician) and Advanced Practice Providers (APPs -  Physician Assistants and Nurse Practitioners) who all work together to provide you with the care you need, when you need it.  We recommend signing up for the patient portal called "MyChart".  Sign up information is provided on this After Visit Summary.  MyChart is used to connect with patients for Virtual Visits (Telemedicine).  Patients are able to view lab/test results, encounter notes, upcoming appointments, etc.  Non-urgent messages can be sent to your provider as well.   To learn more about what you can do with MyChart, go to [email or web address].    Your next appointment:   1 year(s)  The format for your next appointment:   In Person  Provider:   You may see Oswaldo Milian, MD or one of the following Advanced Practice Providers on your designated Care Team:    Rosaria Ferries, PA-C  Jory Sims, DNP, ANP    Other Instructions Check blood pressure twice a day for the next 2 weeks

## 2020-11-26 ENCOUNTER — Telehealth: Payer: Self-pay | Admitting: Hematology

## 2020-11-26 DIAGNOSIS — Z13220 Encounter for screening for lipoid disorders: Secondary | ICD-10-CM | POA: Diagnosis not present

## 2020-11-26 NOTE — Telephone Encounter (Signed)
Called patient regarding upcoming scheduled appointment, patient is notified. °

## 2020-11-27 ENCOUNTER — Other Ambulatory Visit: Payer: Self-pay | Admitting: *Deleted

## 2020-11-27 LAB — LIPID PANEL
Chol/HDL Ratio: 2.4 ratio (ref 0.0–5.0)
Cholesterol, Total: 168 mg/dL (ref 100–199)
HDL: 69 mg/dL (ref >39)
LDL Chol Calc (NIH): 90 mg/dL (ref 0–99)
Triglycerides: 42 mg/dL (ref 0–149)
VLDL Cholesterol Cal: 9 mg/dL (ref 5–40)

## 2020-11-27 MED ORDER — ATORVASTATIN CALCIUM 20 MG PO TABS: 20.0000 mg | ORAL_TABLET | Freq: Every day | ORAL | 3 refills | Status: DC

## 2020-12-01 NOTE — Telephone Encounter (Signed)
Would recommend cutting out red meat

## 2020-12-02 ENCOUNTER — Telehealth: Payer: Self-pay | Admitting: Hematology

## 2020-12-02 ENCOUNTER — Inpatient Hospital Stay: Payer: Medicare Other | Attending: Hematology

## 2020-12-02 ENCOUNTER — Other Ambulatory Visit: Payer: Self-pay

## 2020-12-02 ENCOUNTER — Inpatient Hospital Stay (HOSPITAL_BASED_OUTPATIENT_CLINIC_OR_DEPARTMENT_OTHER): Payer: Medicare Other | Admitting: Hematology

## 2020-12-02 VITALS — BP 142/67 | HR 60 | Temp 97.4°F | Resp 15 | Ht 69.0 in | Wt 160.6 lb

## 2020-12-02 DIAGNOSIS — Z923 Personal history of irradiation: Secondary | ICD-10-CM | POA: Diagnosis not present

## 2020-12-02 DIAGNOSIS — Z720 Tobacco use: Secondary | ICD-10-CM | POA: Diagnosis not present

## 2020-12-02 DIAGNOSIS — G709 Myoneural disorder, unspecified: Secondary | ICD-10-CM | POA: Diagnosis not present

## 2020-12-02 DIAGNOSIS — Z8546 Personal history of malignant neoplasm of prostate: Secondary | ICD-10-CM | POA: Insufficient documentation

## 2020-12-02 DIAGNOSIS — C9110 Chronic lymphocytic leukemia of B-cell type not having achieved remission: Secondary | ICD-10-CM

## 2020-12-02 LAB — CMP (CANCER CENTER ONLY)
ALT: 10 U/L (ref 0–44)
AST: 16 U/L (ref 15–41)
Albumin: 3.9 g/dL (ref 3.5–5.0)
Alkaline Phosphatase: 72 U/L (ref 38–126)
Anion gap: 7 (ref 5–15)
BUN: 19 mg/dL (ref 8–23)
CO2: 29 mmol/L (ref 22–32)
Calcium: 9.3 mg/dL (ref 8.9–10.3)
Chloride: 105 mmol/L (ref 98–111)
Creatinine: 1.02 mg/dL (ref 0.61–1.24)
GFR, Estimated: >60 mL/min (ref >60)
Glucose, Bld: 106 mg/dL — ABNORMAL HIGH (ref 70–99)
Potassium: 4.5 mmol/L (ref 3.5–5.1)
Sodium: 141 mmol/L (ref 135–145)
Total Bilirubin: 0.5 mg/dL (ref 0.3–1.2)
Total Protein: 7.1 g/dL (ref 6.5–8.1)

## 2020-12-02 LAB — CBC WITH DIFFERENTIAL/PLATELET
Abs Immature Granulocytes: 0.01 10*3/uL (ref 0.00–0.07)
Basophils Absolute: 0.1 10*3/uL (ref 0.0–0.1)
Basophils Relative: 1 %
Eosinophils Absolute: 0.2 10*3/uL (ref 0.0–0.5)
Eosinophils Relative: 1 %
HCT: 46 % (ref 39.0–52.0)
Hemoglobin: 14.9 g/dL (ref 13.0–17.0)
Immature Granulocytes: 0 %
Lymphocytes Relative: 58 %
Lymphs Abs: 7.6 10*3/uL — ABNORMAL HIGH (ref 0.7–4.0)
MCH: 29.2 pg (ref 26.0–34.0)
MCHC: 32.4 g/dL (ref 30.0–36.0)
MCV: 90.2 fL (ref 80.0–100.0)
Monocytes Absolute: 0.8 10*3/uL (ref 0.1–1.0)
Monocytes Relative: 6 %
Neutro Abs: 4.5 10*3/uL (ref 1.7–7.7)
Neutrophils Relative %: 34 %
Platelets: 256 10*3/uL (ref 150–400)
RBC: 5.1 MIL/uL (ref 4.22–5.81)
RDW: 13 % (ref 11.5–15.5)
WBC: 13.1 10*3/uL — ABNORMAL HIGH (ref 4.0–10.5)
nRBC: 0 % (ref 0.0–0.2)

## 2020-12-02 LAB — LACTATE DEHYDROGENASE: LDH: 164 U/L (ref 98–192)

## 2020-12-02 NOTE — Telephone Encounter (Signed)
Scheduled appointments per 1/12 los. Spoke to patient who is aware of appointments date and times.  

## 2020-12-02 NOTE — Progress Notes (Signed)
HEMATOLOGY/ONCOLOGY CONSULTATION NOTE  Date of Service: 12/02/2020  Patient Care Team: Shawnee Knapp, MD as PCP - General (Family Medicine) Monna Fam, MD as Consulting Physician (Ophthalmology) Wonda Horner, MD as Consulting Physician (Gastroenterology)  CHIEF COMPLAINTS/PURPOSE OF CONSULTATION:  F/u for CLL HISTORY OF PRESENTING ILLNESS:   Dominic Butler is a wonderful 80 y.o. male who has been referred to [place] by Dr. Delman Cheadle for evaluation and management of leukocytosis. The pt reports that he is doing well overall.   The pt reports that he had Prostate Cancer in 2005-2006 that was treated with radiation seeds. Pt has allergies and Plantar fasciitis. Dr. Brigitte Pulse is concerned that he has a heart murmur. Pt has a follow-up with her in September. He has had his Appendix and Gallbladder removed. Pt denies any seizures, strokes, or neurological disorders.   He denies any significant changes or new symptoms in the last six months.  Pt lost about 5 lbs over the last few months, but attributes this to the work he does around the house and limited diet.   Most recent lab results (02/03/2020) of CBC w/diff is as follows: all values are WNL except for WBC at 14.9K, RDW at 38.1, Baso Abs at 0.3K, Lymphs Abs at 10.9K, Neutro Rel at 20, Mono Rel at 73, Baso Rel at 2.  On review of systems, pt denies fevers, chills, night sweats, unexpected weight loss, rash, fatigue, new lumps/bumps and any other symptoms.   On PMHx the pt reports Prostate cancer, Allergy, Appendectomy, Cholecystectomy, Insertion Prostate Radiation Seed. Plantar fasciitis. On Social Hx the pt reports that he smokes a pipe daily. He does not drink much alcohol and denies any other drug use. He is a retired Web designer. On Family Hx the pt reports his sister had Lung cancer and his father had Liver cancer.    INTERVAL HISTORY:  Dominic Butler is a wonderful 80 y.o. 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 The patient's last visit with [place] was on 07/29/2020. The pt reports that he is doing well overall.  The pt reports no acute new symptoms.  Good po intake.  NO fevers/chills/night sweats/unexpected weight loss.  Labs reviewed  MEDICAL HISTORY:  Past Medical History:  Diagnosis Date  . Allergy   . 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 Cancer [facility])    Prostate cancer status post radiation seeds  . Elevated prostate specific antigen (PSA)    has had seed implants and also seen by [facility]  . Neuromuscular disorder [facility])     SURGICAL HISTORY: Past Surgical History:  Procedure Laterality Date  . APPENDECTOMY    . CHOLECYSTECTOMY    . INSERTION PROSTATE RADIATION SEED    . SHOULDER SURGERY      SOCIAL HISTORY: Social History   Socioeconomic History  . Marital status: Divorced    Spouse name: Not on file  . Number of children: Not on file  . Years of education: Not on file  . Highest education level: Not on file  Occupational History  . Not on file  Tobacco Use  . Smoking status: Current Every Day Smoker    Types: Pipe  . Smokeless tobacco: Never Used  Substance and Sexual Activity  . Alcohol use: No  . Drug use: No  . Sexual activity: Not on file  Other Topics  Concern  . Not on file  Social History Narrative  . Not on file   Social Determinants of Health   Financial Resource Strain: Not on file  Food Insecurity: Not on file  Transportation Needs: Not on file  Physical Activity: Not on file  Stress: Not on file  Social Connections: Not on file  Intimate Partner Violence: Not on file    FAMILY HISTORY: Family History  Problem Relation Age of Onset  . Heart attack Mother     ALLERGIES:  has No Known Allergies.  MEDICATIONS:  Current Outpatient Medications  Medication Sig Dispense Refill  . amLODipine (NORVASC) 10 MG tablet Take 1 tablet (10 mg total) by mouth  daily. 90 tablet 3  . atorvastatin (LIPITOR) 20 MG tablet Take 1 tablet (20 mg total) by mouth daily. 90 tablet 3   No current facility-administered medications for this visit.    REVIEW OF SYSTEMS:   A 10+ POINT REVIEW OF SYSTEMS WAS OBTAINED including neurology, dermatology, psychiatry, cardiac, respiratory, lymph, extremities, GI, GU, Musculoskeletal, constitutional, breasts, reproductive, HEENT.  All pertinent positives are noted in the HPI.  All others are negative.   PHYSICAL EXAMINATION: ECOG PERFORMANCE STATUS: 0 - Asymptomatic  . Vitals:   12/02/20 1412  BP: (!) 142/67  Pulse: 60  Resp: 15  Temp: (!) 97.4 F (36.3 C)  SpO2: 97%   Filed Weights   12/02/20 1412  Weight: 160 lb 9.6 oz (72.8 kg)   .Body mass index is 23.72 kg/m.   NAD GENERAL:alert, in no acute distress and comfortable SKIN: no acute rashes, no significant lesions EYES: conjunctiva are pink and non-injected, sclera anicteric OROPHARYNX: MMM, no exudates, no oropharyngeal erythema or ulceration NECK: supple, no JVD LYMPH:  no palpable lymphadenopathy in the cervical, axillary or inguinal regions LUNGS: clear to auscultation b/l with normal respiratory effort HEART: regular rate & rhythm ABDOMEN:  normoactive bowel sounds , non tender, not distended. No palpable hepatosplenomegaly.  Extremity: no pedal edema PSYCH: alert & oriented x 3 with fluent speech NEURO: no focal motor/sensory deficits  LABORATORY DATA:  I have reviewed the data as listed  . CBC Latest Ref Rng & Units 12/02/2020 07/08/2020 08/29/2017  WBC 4.0 - 10.5 K/uL 13.1(H) 13.3(H) 13.0(H)  Hemoglobin 13.0 - 17.0 g/dL 14.9 14.5 14.5  Hematocrit 39.0 - 52.0 % 46.0 44.2 43.1  Platelets 150 - 400 K/uL 256 256 235    . CMP Latest Ref Rng & Units 12/02/2020 07/08/2020 08/16/2017  Glucose 70 - 99 mg/dL 106(H) 97 97  BUN 8 - 23 mg/dL 19 19 14   Creatinine 0.61 - 1.24 mg/dL 1.02 0.94 0.98  Sodium 135 - 145 mmol/L 141 139 143  Potassium 3.5  - 5.1 mmol/L 4.5 4.9 4.6  Chloride 98 - 111 mmol/L 105 106 104  CO2 22 - 32 mmol/L 29 25 29   Calcium 8.9 - 10.3 mg/dL 9.3 9.3 9.0  Total Protein 6.5 - 8.1 g/dL 7.1 6.3(L) 6.1  Total Bilirubin 0.3 - 1.2 mg/dL 0.5 0.2(L) 0.2  Alkaline Phos 38 - 126 U/L 72 83 66  AST 15 - 41 U/L 16 14(L) 19  ALT 0 - 44 U/L 10 9 13    . 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     Component     Latest Ref Rng & Units 03/10/2016 08/16/2017 08/29/2017  WBC     3.4 - 10.8 x10E3/uL 10.2 13.4 (H) 13.0 (H)    RADIOGRAPHIC STUDIES: I have personally reviewed the radiological images as listed and agreed with the findings in the report. No results found.  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 -no indication for treatment of patients CLL at this time -PET/CT to complete staging   FOLLOW UP: PEt/CT in 22 weeks  RTC with Dr Irene Limbo with labs in 24 weeks   The total time spent in the appt was 20 minutes and more than 50% was on counseling and direct patient cares.  All of the patient's questions were answered with apparent satisfaction. The patient knows to call the clinic with any problems, questions or concerns.    Sullivan Lone MD [place] AAHIVMS [facility] Hematology/Oncology  Physician [facility]  (Office):       [phone] (Work cell):  [phone] (Fax):           [phone]  12/02/2020 4:28 AM  I, Yevette Edwards, am acting as a scribe for Dr. Sullivan Lone.   .I have reviewed the above documentation for accuracy and completeness, and I agree with the above. Brunetta Genera MD

## 2020-12-03 LAB — PROSTATE-SPECIFIC AG, SERUM (LABCORP): Prostate Specific Ag, Serum: <0.1 ng/mL (ref 0.0–4.0)

## 2020-12-04 DIAGNOSIS — Z1152 Encounter for screening for COVID-19: Secondary | ICD-10-CM | POA: Diagnosis not present

## 2020-12-28 IMAGING — CR DG CHEST 2V
2 series · 2 of 2 positions shown · non-contrast
Comparison: Chest x-ray dated April 24, 2014.

CLINICAL DATA: Chronic fatigue.  Bibasilar crackles.

EXAM:
CHEST - 2 VIEW

[w chest pa]
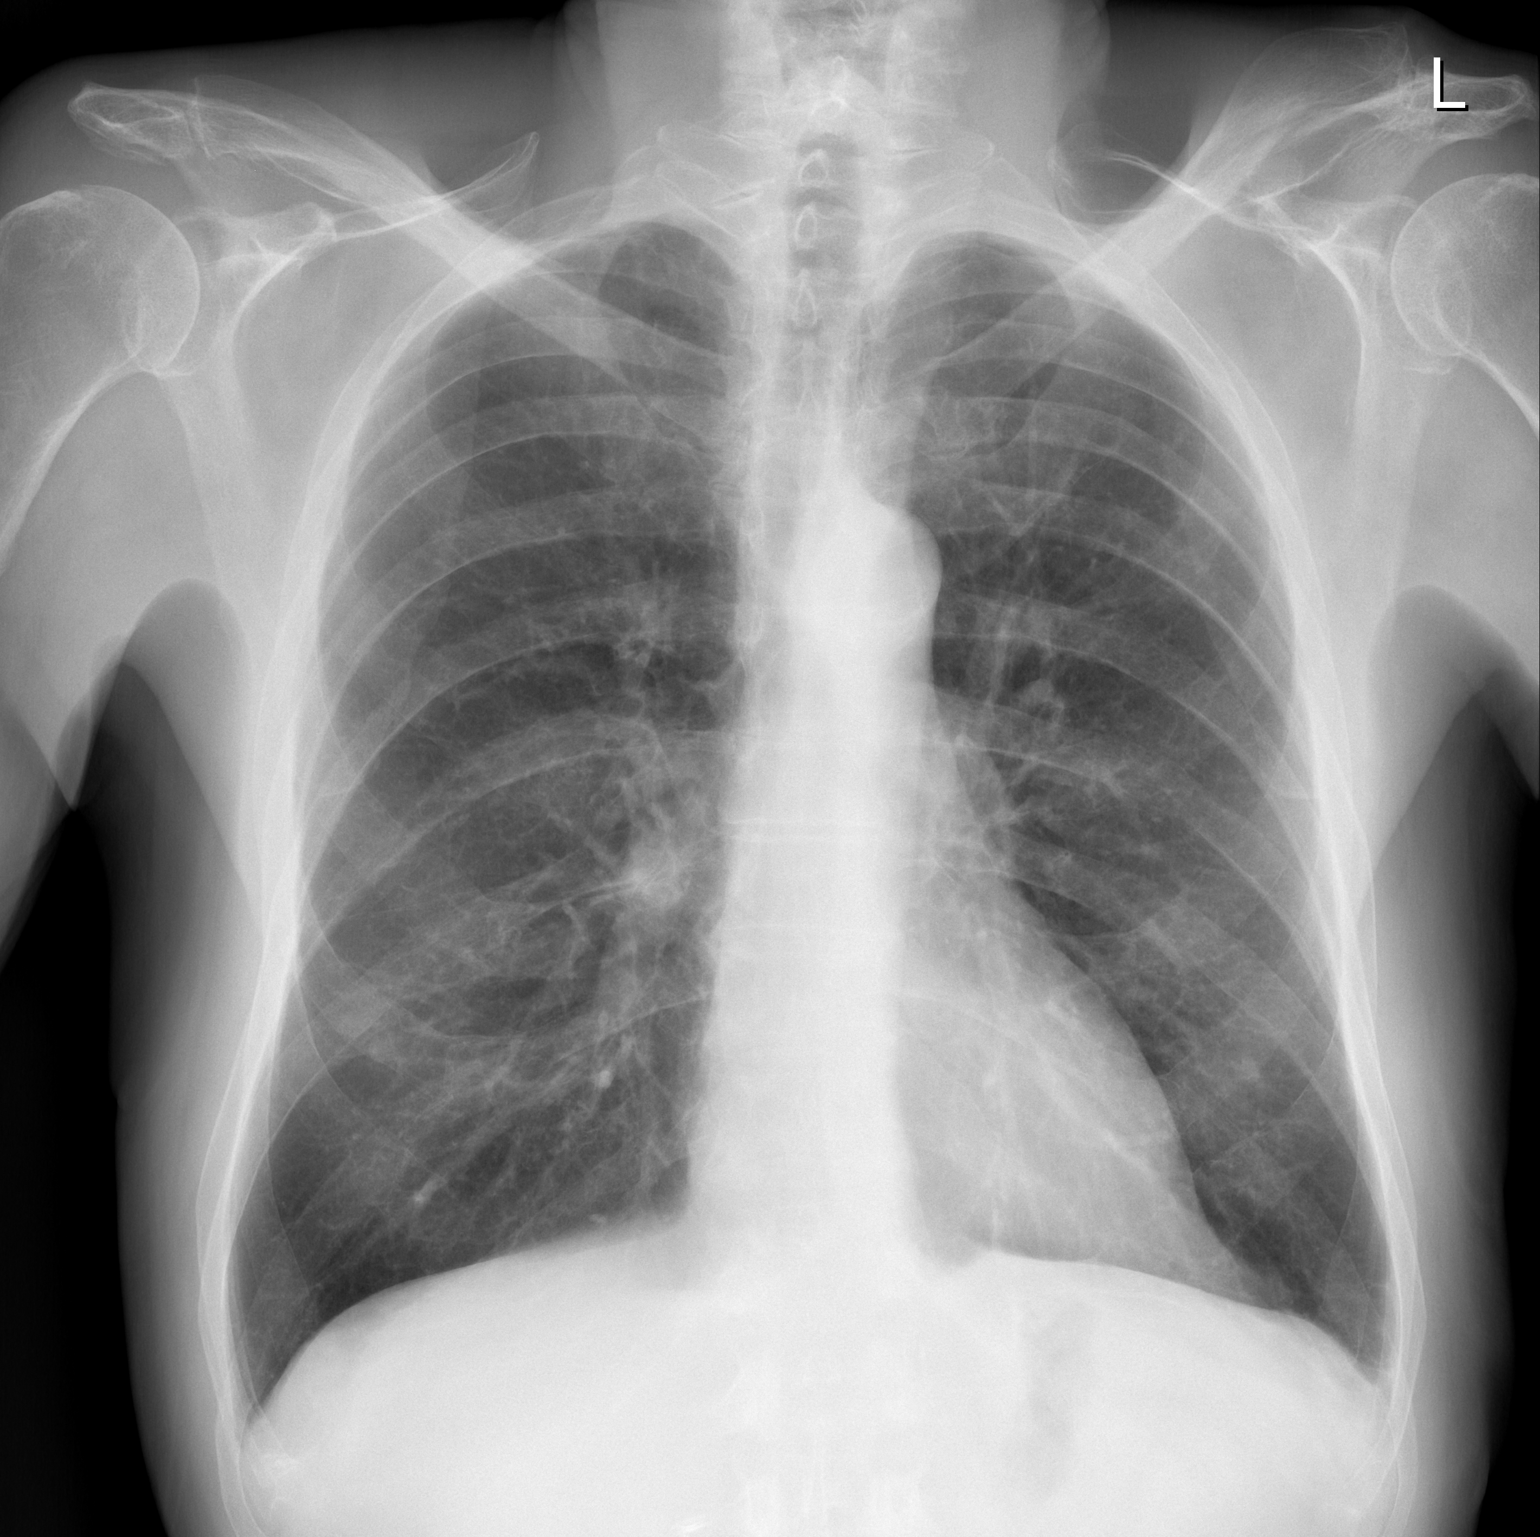

[w chest lat]
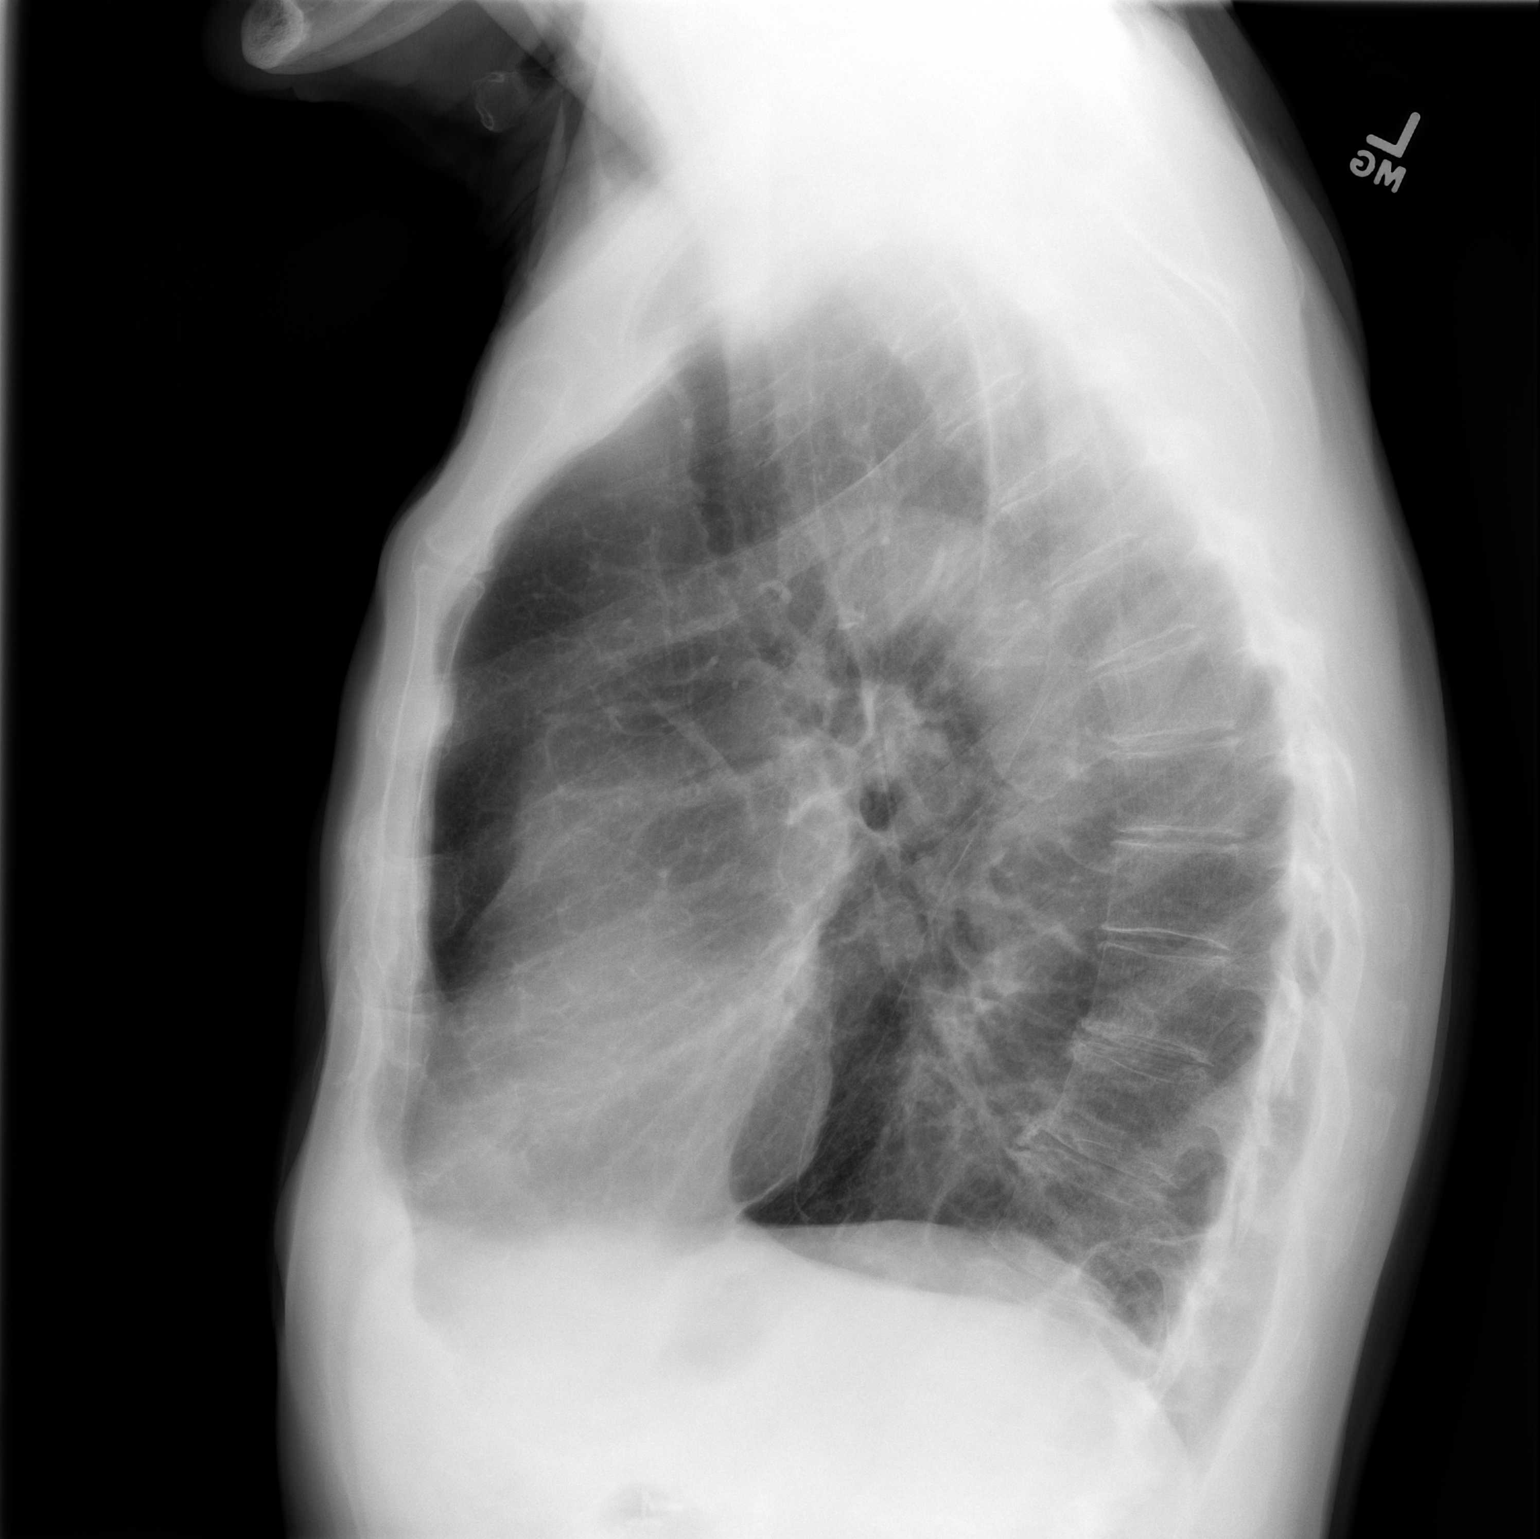

[2 of 2 positions shown; findings below may reference images not displayed]

FINDINGS: The heart size and mediastinal contours are within normal limits.
Normal pulmonary vascularity. The lungs remain mildly hyperinflated.
No focal consolidation, pleural effusion, or pneumothorax. No acute
osseous abnormality.
IMPRESSION: 1. No acute cardiopulmonary disease.
2. COPD.

## 2021-03-05 ENCOUNTER — Telehealth: Payer: Self-pay | Admitting: Hematology

## 2021-03-05 NOTE — Telephone Encounter (Signed)
Left message with rescheduled upcoming appointment due to provider's PAL. Gave option to call back to reschedule if needed. 

## 2021-04-06 DIAGNOSIS — Z23 Encounter for immunization: Secondary | ICD-10-CM | POA: Diagnosis not present

## 2021-04-20 NOTE — Progress Notes (Signed)
HEMATOLOGY/ONCOLOGY CONSULTATION NOTE  Date of Service: 04/21/2021  Patient Care Team: Shawnee Knapp, MD as PCP - General (Family Medicine) Monna Fam, MD as Consulting Physician (Ophthalmology) Wonda Horner, MD as Consulting Physician (Gastroenterology)  CHIEF COMPLAINTS/PURPOSE OF CONSULTATION:  F/u for CLL HISTORY OF PRESENTING ILLNESS:   Dominic Butler is a wonderful 80 y.o. male who has been referred to [place] by Dr. Delman Cheadle for evaluation and management of leukocytosis. The pt reports that he is doing well overall.   The pt reports that he had Prostate Cancer in 2005-2006 that was treated with radiation seeds. Pt has allergies and Plantar fasciitis. Dr. Brigitte Pulse is concerned that he has a heart murmur. Pt has a follow-up with her in September. He has had his Appendix and Gallbladder removed. Pt denies any seizures, strokes, or neurological disorders.   He denies any significant changes or new symptoms in the last six months.  Pt lost about 5 lbs over the last few months, but attributes this to the work he does around the house and limited diet.   Most recent lab results (02/03/2020) of CBC w/diff is as follows: all values are WNL except for WBC at 14.9K, RDW at 38.1, Baso Abs at 0.3K, Lymphs Abs at 10.9K, Neutro Rel at 20, Mono Rel at 73, Baso Rel at 2.  On review of systems, pt denies fevers, chills, night sweats, unexpected weight loss, rash, fatigue, new lumps/bumps and any other symptoms.   On PMHx the pt reports Prostate cancer, Allergy, Appendectomy, Cholecystectomy, Insertion Prostate Radiation Seed. Plantar fasciitis. On Social Hx the pt reports that he smokes a pipe daily. He does not drink much alcohol and denies any other drug use. He is a retired Web designer. On Family Hx the pt reports his sister had Lung cancer and his father had Liver cancer.    INTERVAL HISTORY:  Dominic Butler is a wonderful 80 y.o. 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 The patient's last visit with [place] was on 12/02/2020. The pt reports that he is doing well overall.  The pt reports no new concerns or symptoms over the last six months. He notes some intermittent fatigue that is not bothersome nor hindersome. He is able to do all activities he desires to do. He continues to eat well. He does not eat much fatty foods and continues to eat healthy. The pt does note some weight loss since last visit. The pt notes he has been prescribed Lipitor for his cholesterol levels, but he is not a big proponent of wanting to take this.  Lab results today 04/21/2021 of CBC w/diff and CMP is as follows: all values are WNL except for WBC of 13.8K, Lymphs Abs of 8.3K.  06/01/202 LDH of 167.  On review of systems, pt reports weight loss, leg cramps at nighttime and denies fevers, chills, night sweats, infection issues, decreased appetite, new lumps/bumps, abdominal pain, back pain, acute urinary symptoms, acute bone pains, and any other symptoms.  MEDICAL HISTORY:  Past Medical History:  Diagnosis Date  . Allergy   . 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 Cancer [facility])    Prostate cancer status post radiation seeds  . Elevated prostate specific antigen (PSA)    has had seed implants and also seen by [facility]  . Neuromuscular disorder ([place])  SURGICAL HISTORY: Past Surgical History:  Procedure Laterality Date  . APPENDECTOMY    . CHOLECYSTECTOMY    . INSERTION PROSTATE RADIATION SEED    . SHOULDER SURGERY      SOCIAL HISTORY: Social History   Socioeconomic History  . Marital status: Divorced    Spouse name: Not on file  . Number of children: Not on file  . Years of education: Not on file  . Highest education level: Not on file  Occupational History  . Not on file  Tobacco Use  . Smoking status: Current Every Day Smoker    Types: Pipe  . Smokeless tobacco: Never Used   Substance and Sexual Activity  . Alcohol use: No  . Drug use: No  . Sexual activity: Not on file  Other Topics Concern  . Not on file  Social History Narrative  . Not on file   Social Determinants of Health   Financial Resource Strain: Not on file  Food Insecurity: Not on file  Transportation Needs: Not on file  Physical Activity: Not on file  Stress: Not on file  Social Connections: Not on file  Intimate Partner Violence: Not on file    FAMILY HISTORY: Family History  Problem Relation Age of Onset  . Heart attack Mother     ALLERGIES:  has No Known Allergies.  MEDICATIONS:  Current Outpatient Medications  Medication Sig Dispense Refill  . amLODipine (NORVASC) 10 MG tablet Take 1 tablet (10 mg total) by mouth daily. 90 tablet 3  . atorvastatin (LIPITOR) 20 MG tablet Take 1 tablet (20 mg total) by mouth daily. 90 tablet 3   No current facility-administered medications for this visit.    REVIEW OF SYSTEMS:   10 Point review of Systems was done is negative except as noted above.  PHYSICAL EXAMINATION: ECOG PERFORMANCE STATUS: 0 - Asymptomatic  . Vitals:   04/21/21 1511  BP: (!) 146/66  Pulse: (!) 55  Resp: 17  Temp: (!) 97.5 F (36.4 C)  SpO2: 95%   Filed Weights   04/21/21 1511  Weight: 157 lb 8 oz (71.4 kg)   .Body mass index is 23.26 kg/m.   NAD.  GENERAL:alert, in no acute distress and comfortable SKIN: no acute rashes, no significant lesions EYES: conjunctiva are pink and non-injected, sclera anicteric OROPHARYNX: MMM, no exudates, no oropharyngeal erythema or ulceration NECK: supple, no JVD LYMPH:  no palpable lymphadenopathy in the cervical, axillary or inguinal regions LUNGS: clear to auscultation b/l with normal respiratory effort HEART: regular rate & rhythm ABDOMEN:  normoactive bowel sounds , non tender, not distended. No palpable hepatosplenomegaly.  Extremity: no pedal edema PSYCH: alert & oriented x 3 with fluent speech NEURO: no  focal motor/sensory deficits  LABORATORY DATA:  I have reviewed the data as listed  . CBC Latest Ref Rng & Units 04/21/2021 12/02/2020 07/08/2020  WBC 4.0 - 10.5 K/uL 13.8(H) 13.1(H) 13.3(H)  Hemoglobin 13.0 - 17.0 g/dL 13.9 14.9 14.5  Hematocrit 39.0 - 52.0 % 42.4 46.0 44.2  Platelets 150 - 400 K/uL 247 256 256    . CMP Latest Ref Rng & Units 04/21/2021 12/02/2020 07/08/2020  Glucose 70 - 99 mg/dL 98 106(H) 97  BUN 8 - 23 mg/dL 22 19 19   Creatinine 0.61 - 1.24 mg/dL 0.93 1.02 0.94  Sodium 135 - 145 mmol/L 142 141 139  Potassium 3.5 - 5.1 mmol/L 4.1 4.5 4.9  Chloride 98 - 111 mmol/L 108 105 106  CO2 22 - 32 mmol/L 24  29 25  Calcium 8.9 - 10.3 mg/dL 9.2 9.3 9.3  Total Protein 6.5 - 8.1 g/dL 6.8 7.1 6.3(L)  Total Bilirubin 0.3 - 1.2 mg/dL 0.5 0.5 0.2(L)  Alkaline Phos 38 - 126 U/L 71 72 83  AST 15 - 41 U/L 15 16 14(L)  ALT 0 - 44 U/L 9 10 9    . 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     Component     Latest Ref Rng & Units 03/10/2016 08/16/2017 08/29/2017  WBC     3.4 - 10.8 x10E3/uL 10.2 13.4 (H) 13.0 (H)    RADIOGRAPHIC STUDIES: I have personally reviewed the radiological images as listed and agreed with the findings in the report. No results found.  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 -Advised pt that he does most likely not meet criteria for Evusheld nor would he need this due to decreased vaccine response at this time. -Recommended pt receive the second COVID booster shot as recently approved. The pt has already received his second booster. -Discussed PET scan as ordered. The pt notes he was never called about this. Advised pt due to lack of symptoms there is no rush. Will hold for now. -Advised pt his CLL is behaving as we would expect the 13q deletion to behave at this time. -Discussed alternating visits with PCP every 6 months. -No new symptoms suggestive of CLL progression. -No indication for treatment of patients CLL at this time -Will see back in 6 months with labs.   FOLLOW UP: RTC with Dr Irene Limbo with labs in 6 months   The total time spent in the appt was 20 minutes and more than 50% was on counseling and direct patient cares.  All of the patient's questions were answered with apparent satisfaction. The patient knows to call the clinic with any problems, questions or concerns.    Sullivan Lone MD [place] AAHIVMS [facility] Hematology/Oncology Physician [facility]  (Office):       [phone] (Work cell):  [phone] (Fax):           [phone]  04/21/2021 3:41 PM  I, Reinaldo Raddle, am acting as scribe for Dr. Sullivan Lone, MD.    .I have reviewed the above documentation for accuracy and completeness, and I agree with the above. Brunetta Genera MD

## 2021-04-21 ENCOUNTER — Telehealth: Payer: Self-pay | Admitting: Hematology

## 2021-04-21 ENCOUNTER — Inpatient Hospital Stay: Payer: Medicare Other | Attending: Hematology

## 2021-04-21 ENCOUNTER — Inpatient Hospital Stay (HOSPITAL_BASED_OUTPATIENT_CLINIC_OR_DEPARTMENT_OTHER): Payer: Medicare Other | Admitting: Hematology

## 2021-04-21 ENCOUNTER — Other Ambulatory Visit: Payer: Self-pay

## 2021-04-21 VITALS — BP 146/66 | HR 55 | Temp 97.5°F | Resp 17 | Ht 69.0 in | Wt 157.5 lb

## 2021-04-21 DIAGNOSIS — C9110 Chronic lymphocytic leukemia of B-cell type not having achieved remission: Secondary | ICD-10-CM

## 2021-04-21 LAB — CMP (CANCER CENTER ONLY)
ALT: 9 U/L (ref 0–44)
AST: 15 U/L (ref 15–41)
Albumin: 3.7 g/dL (ref 3.5–5.0)
Alkaline Phosphatase: 71 U/L (ref 38–126)
Anion gap: 10 (ref 5–15)
BUN: 22 mg/dL (ref 8–23)
CO2: 24 mmol/L (ref 22–32)
Calcium: 9.2 mg/dL (ref 8.9–10.3)
Chloride: 108 mmol/L (ref 98–111)
Creatinine: 0.93 mg/dL (ref 0.61–1.24)
GFR, Estimated: >60 mL/min (ref >60)
Glucose, Bld: 98 mg/dL (ref 70–99)
Potassium: 4.1 mmol/L (ref 3.5–5.1)
Sodium: 142 mmol/L (ref 135–145)
Total Bilirubin: 0.5 mg/dL (ref 0.3–1.2)
Total Protein: 6.8 g/dL (ref 6.5–8.1)

## 2021-04-21 LAB — CBC WITH DIFFERENTIAL/PLATELET
Abs Immature Granulocytes: 0.03 10*3/uL (ref 0.00–0.07)
Basophils Absolute: 0.1 10*3/uL (ref 0.0–0.1)
Basophils Relative: 1 %
Eosinophils Absolute: 0.2 10*3/uL (ref 0.0–0.5)
Eosinophils Relative: 2 %
HCT: 42.4 % (ref 39.0–52.0)
Hemoglobin: 13.9 g/dL (ref 13.0–17.0)
Immature Granulocytes: 0 %
Lymphocytes Relative: 59 %
Lymphs Abs: 8.3 10*3/uL — ABNORMAL HIGH (ref 0.7–4.0)
MCH: 29.4 pg (ref 26.0–34.0)
MCHC: 32.8 g/dL (ref 30.0–36.0)
MCV: 89.6 fL (ref 80.0–100.0)
Monocytes Absolute: 0.8 10*3/uL (ref 0.1–1.0)
Monocytes Relative: 6 %
Neutro Abs: 4.4 10*3/uL (ref 1.7–7.7)
Neutrophils Relative %: 32 %
Platelets: 247 10*3/uL (ref 150–400)
RBC: 4.73 MIL/uL (ref 4.22–5.81)
RDW: 13.2 % (ref 11.5–15.5)
WBC: 13.8 10*3/uL — ABNORMAL HIGH (ref 4.0–10.5)
nRBC: 0 % (ref 0.0–0.2)

## 2021-04-21 LAB — LACTATE DEHYDROGENASE: LDH: 167 U/L (ref 98–192)

## 2021-04-21 NOTE — Telephone Encounter (Signed)
Scheduled follow-up appointment per 6/1 los. Patient is aware.

## 2021-04-28 ENCOUNTER — Ambulatory Visit: Payer: Medicare Other | Admitting: Hematology

## 2021-04-28 ENCOUNTER — Other Ambulatory Visit: Payer: Medicare Other

## 2021-06-30 DIAGNOSIS — L0231 Cutaneous abscess of buttock: Secondary | ICD-10-CM | POA: Diagnosis not present

## 2021-07-02 DIAGNOSIS — Z0000 Encounter for general adult medical examination without abnormal findings: Secondary | ICD-10-CM | POA: Diagnosis not present

## 2021-07-02 DIAGNOSIS — R52 Pain, unspecified: Secondary | ICD-10-CM | POA: Diagnosis not present

## 2021-07-27 ENCOUNTER — Encounter: Payer: Self-pay | Admitting: Hematology

## 2021-07-27 DIAGNOSIS — E785 Hyperlipidemia, unspecified: Secondary | ICD-10-CM

## 2021-07-28 ENCOUNTER — Other Ambulatory Visit: Payer: Self-pay | Admitting: *Deleted

## 2021-07-28 DIAGNOSIS — E785 Hyperlipidemia, unspecified: Secondary | ICD-10-CM

## 2021-07-28 NOTE — Telephone Encounter (Signed)
Sure let's repeat a lipid panel

## 2021-07-30 DIAGNOSIS — E785 Hyperlipidemia, unspecified: Secondary | ICD-10-CM | POA: Diagnosis not present

## 2021-07-30 LAB — LIPID PANEL
Chol/HDL Ratio: 2.7 ratio (ref 0.0–5.0)
Cholesterol, Total: 164 mg/dL (ref 100–199)
HDL: 61 mg/dL (ref >39)
LDL Chol Calc (NIH): 88 mg/dL (ref 0–99)
Triglycerides: 82 mg/dL (ref 0–149)
VLDL Cholesterol Cal: 15 mg/dL (ref 5–40)

## 2021-08-03 MED ORDER — ATORVASTATIN CALCIUM 20 MG PO TABS: 20.0000 mg | ORAL_TABLET | Freq: Every day | ORAL | 3 refills | Status: DC

## 2021-08-03 NOTE — Addendum Note (Signed)
Addended by: Patria Mane A on: 08/03/2021 09:09 AM   Modules accepted: Orders

## 2021-08-20 DIAGNOSIS — Z23 Encounter for immunization: Secondary | ICD-10-CM | POA: Diagnosis not present

## 2021-10-10 NOTE — Progress Notes (Signed)
Cardiology Office Note:    Date:  10/12/2021   ID:  Dominic Butler, Dominic Butler Apr 18, 1941, MRN [id number]  PCP:  Dominic Knapp, MD  Cardiologist:  Dominic Mako, MD  Electrophysiologist:  None   Referring MD: Dominic Knapp, MD   Chief Complaint  Patient presents with   Hypertension     History of Present Illness:    Dominic Butler is a 80 y.o. 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  He was referred by Dr. Brigitte Butler for evaluation of abnormal EKG and heart murmur, initially seen on 07/24/2020.  He denies any chest pain or dyspnea.  Reports he does yard work every day and he denies any exertional symptoms.  Denies any lightheadedness or syncope recently.  Does report he had an episode of lightheadedness while driving 3 years ago, but none since.  He denies any palpitations or lower extremity edema.  Smokes pipe every day.  Mother died of MI at age 51.  Echocardiogram on 08/11/2020 showed normal biventricular function, moderate LVH, grade 2 diastolic dysfunction, mild aortic stenosis.  Zio patch x7 days showed 12 episodes of SVT, longest lasting 13 seconds.  Since last clinic visit, he reports he has been doing well.  Denies any chest pain, dyspnea, lightheadedness, syncope, lower extremity edema.  Did have a recent episode of palpitations with lasted less than 15 seconds.  Reports he does yard work every day for exercise.   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, had seen urology and done all the studies, no known cause   Cancer ([place])    Prostate cancer status post radiation seeds   Elevated prostate specific antigen (PSA)    has had seed implants and also seen by [facility]   Neuromuscular disorder [facility])     Past Surgical History:  Procedure Laterality Date    APPENDECTOMY     CHOLECYSTECTOMY     INSERTION PROSTATE RADIATION SEED     SHOULDER SURGERY      Current Medications: Current Meds  Medication Sig   amLODipine (NORVASC) 10 MG tablet Take 1 tablet (10 mg total) by mouth daily.   atorvastatin (LIPITOR) 20 MG tablet Take 1 tablet (20 mg total) by mouth daily.     Allergies:   Patient has no known allergies.   Social History   Socioeconomic History   Marital status: Divorced    Spouse name: Not on file   Number of children: Not on file   Years of education: Not on file   Highest education level: Not on file  Occupational History   Not on file  Tobacco Use   Smoking status: Every Day    Types: Pipe   Smokeless tobacco: Never  Substance and Sexual Activity   Alcohol use: No   Drug use: No   Sexual activity: Not on file  Other Topics Concern   Not on file  Social History Narrative   Not on file   Social Determinants of Health   Financial Resource Strain: Not on file  Food Insecurity: Not on file  Transportation Needs: Not on file  Physical Activity: Not on file  Stress: Not on file  Social Connections: Not on file     Family  History: The patient's family history includes Heart attack in his mother.  ROS:   Please see the history of present illness.     All other systems reviewed and are negative.  EKGs/Labs/Other Studies Reviewed:    The following studies were reviewed today:   EKG:   10/12/21: Sinus bradycardia, rate 56, right bundle branch block, left anterior fascicular block  Recent Labs: 04/21/2021: ALT 9; BUN 22; Creatinine 0.93; Hemoglobin 13.9; Platelets 247; Potassium 4.1; Sodium 142  Recent Lipid Panel    Component Value Date/Time   CHOL 164 07/30/2021 0841   TRIG 82 07/30/2021 0841   HDL 61 07/30/2021 0841   CHOLHDL 2.7 07/30/2021 0841   CHOLHDL 1.9 02/20/2015 1021   VLDL 9 02/20/2015 1021   LDLCALC 88 07/30/2021 0841    Physical Exam:    VS:  BP (!) 166/80   Butler (!) 56   Ht 5\' 9"   (1.753 m)   Wt 163 lb 6.4 oz (74.1 kg)   SpO2 97%   BMI 24.13 kg/m     Wt Readings from Last 3 Encounters:  10/12/21 163 lb 6.4 oz (74.1 kg)  04/21/21 157 lb 8 oz (71.4 kg)  12/02/20 160 lb 9.6 oz (72.8 kg)     GEN: Well nourished, well developed in no acute distress HEENT: Normal NECK: No JVD; No carotid bruits LYMPHATICS: No lymphadenopathy CARDIAC: braydcardic, regular, 3 out of 6 systolic murmur loudest at RUSB RESPIRATORY:  Clear to auscultation without rales, wheezing or rhonchi  ABDOMEN: Soft, non-tender, non-distended MUSCULOSKELETAL:  No edema; No deformity  SKIN: Warm and dry NEUROLOGIC:  Alert and oriented x 3 PSYCHIATRIC:  Normal affect   ASSESSMENT:    1. Aortic valve stenosis, etiology of cardiac valve disease unspecified   2. SVT (supraventricular tachycardia) ([place])   3. Essential hypertension   4. Tobacco use   5. Hyperlipidemia, unspecified hyperlipidemia type     PLAN:     Aortic stenosis: Mild on echocardiogram 08/11/2020.  Will monitor  Bradycardia: Sinus bradycardia to 40s.  Reports remote episode of lightheadedness, but none recently.  No bradyarrhythmias on Zio patch  SVT: Zio patch x7 days on 08/13/2020 showed 12 episodes of SVT, longest lasting 13 seconds.  No symptoms reported during episodes.  Hypertension: On amlodipine 10 mg daily.  Elevated in clinic today but reports did not take his amlodipine today, will asked to check BP twice daily for next week and call with results  Tobacco use: continues to smoke pipe, not interested in quitting.  Counseled on the risks of tobacco use and cessation strongly recommended  Hyperlipidemia: LDL 90 on 11/26/2020, 10-year ASCVD risk score 30%.  Recommended starting statin, he initially declined wanting to try lifestyle changes.  Repeat lipid panel 07/2021 showed LDL 88.  Started atorvastatin 20 mg daily.  Will recheck lipid panel  RTC in 1 year   Medication Adjustments/Labs and Tests Ordered: Current  medicines are reviewed at length with the patient today.  Concerns regarding medicines are outlined above.  Orders Placed This Encounter  Procedures   Lipid panel   EKG 12-Lead    No orders of the defined types were placed in this encounter.   Patient Instructions  Medication Instructions:  Your physician recommends that you continue on your current medications as directed. Please refer to the Current Medication list given to you today.   *If you need a refill on your cardiac medications before your next appointment, please call your pharmacy*  Lab Work: LIPID PANEL  TODAY   If you have labs (blood work) drawn today and your tests are completely normal, you will receive your results only by: [place] (if you have MyChart) OR A paper copy in the mail If you have any lab test that is abnormal or we need to change your treatment, we will call you to review the results.  Testing/Procedures: NONE  Follow-Up: At [facility], you and your health needs are our priority.  As part of our continuing mission to provide you with exceptional heart care, we have created designated Provider Care Teams.  These Care Teams include your primary Cardiologist (physician) and Advanced Practice Providers (APPs -  Physician Assistants and Nurse Practitioners) who all work together to provide you with the care you need, when you need it.  We recommend signing up for the patient portal called "MyChart".  Sign up information is provided on this After Visit Summary.  MyChart is used to connect with patients for Virtual Visits (Telemedicine).  Patients are able to view lab/test results, encounter notes, upcoming appointments, etc.  Non-urgent messages can be sent to your provider as well.   To learn more about what you can do with MyChart, go to [email or web address].    Your next appointment:   12 month(s)  The format for your next appointment:   In Person  Provider:   DR [facility]   Other  Instructions MONITOR AND LOG [place] 1 WEEK CALL THE READINGS OR SEND VIA MYCHART    Signed, Donato Heinz, MD  10/12/2021 5:08 PM    [place]

## 2021-10-12 ENCOUNTER — Other Ambulatory Visit: Payer: Self-pay

## 2021-10-12 ENCOUNTER — Encounter: Payer: Self-pay | Admitting: Cardiology

## 2021-10-12 ENCOUNTER — Ambulatory Visit (INDEPENDENT_AMBULATORY_CARE_PROVIDER_SITE_OTHER): Payer: Medicare Other | Admitting: Cardiology

## 2021-10-12 VITALS — BP 166/80 | HR 56 | Ht 69.0 in | Wt 163.4 lb

## 2021-10-12 DIAGNOSIS — Z720 Tobacco use: Secondary | ICD-10-CM

## 2021-10-12 DIAGNOSIS — I10 Essential (primary) hypertension: Secondary | ICD-10-CM

## 2021-10-12 DIAGNOSIS — I350 Nonrheumatic aortic (valve) stenosis: Secondary | ICD-10-CM | POA: Diagnosis not present

## 2021-10-12 DIAGNOSIS — E785 Hyperlipidemia, unspecified: Secondary | ICD-10-CM

## 2021-10-12 DIAGNOSIS — I471 Supraventricular tachycardia: Secondary | ICD-10-CM | POA: Diagnosis not present

## 2021-10-12 NOTE — Patient Instructions (Addendum)
Medication Instructions:  Your physician recommends that you continue on your current medications as directed. Please refer to the Current Medication list given to you today.   *If you need a refill on your cardiac medications before your next appointment, please call your pharmacy*  Lab Work: LIPID PANEL TODAY   If you have labs (blood work) drawn today and your tests are completely normal, you will receive your results only by: [place] (if you have MyChart) OR A paper copy in the mail If you have any lab test that is abnormal or we need to change your treatment, we will call you to review the results.  Testing/Procedures: NONE  Follow-Up: At [facility], you and your health needs are our priority.  As part of our continuing mission to provide you with exceptional heart care, we have created designated Provider Care Teams.  These Care Teams include your primary Cardiologist (physician) and Advanced Practice Providers (APPs -  Physician Assistants and Nurse Practitioners) who all work together to provide you with the care you need, when you need it.  We recommend signing up for the patient portal called "MyChart".  Sign up information is provided on this After Visit Summary.  MyChart is used to connect with patients for Virtual Visits (Telemedicine).  Patients are able to view lab/test results, encounter notes, upcoming appointments, etc.  Non-urgent messages can be sent to your provider as well.   To learn more about what you can do with MyChart, go to [email or web address].    Your next appointment:   12 month(s)  The format for your next appointment:   In Person  Provider:   DR [facility]   Other Instructions [place] [address] [place]

## 2021-10-13 LAB — LIPID PANEL
Chol/HDL Ratio: 2.2 ratio (ref 0.0–5.0)
Cholesterol, Total: 156 mg/dL (ref 100–199)
HDL: 71 mg/dL (ref >39)
LDL Chol Calc (NIH): 75 mg/dL (ref 0–99)
Triglycerides: 47 mg/dL (ref 0–149)
VLDL Cholesterol Cal: 10 mg/dL (ref 5–40)

## 2021-10-19 ENCOUNTER — Encounter: Payer: Self-pay | Admitting: *Deleted

## 2021-10-20 ENCOUNTER — Other Ambulatory Visit: Payer: Self-pay

## 2021-10-20 DIAGNOSIS — C9110 Chronic lymphocytic leukemia of B-cell type not having achieved remission: Secondary | ICD-10-CM

## 2021-10-21 ENCOUNTER — Inpatient Hospital Stay: Payer: Medicare Other | Attending: Hematology | Admitting: Hematology

## 2021-10-21 ENCOUNTER — Other Ambulatory Visit: Payer: Self-pay

## 2021-10-21 ENCOUNTER — Inpatient Hospital Stay: Payer: Medicare Other

## 2021-10-21 VITALS — BP 161/63 | HR 45 | Temp 97.5°F | Wt 166.1 lb

## 2021-10-21 DIAGNOSIS — L84 Corns and callosities: Secondary | ICD-10-CM | POA: Diagnosis not present

## 2021-10-21 DIAGNOSIS — C9110 Chronic lymphocytic leukemia of B-cell type not having achieved remission: Secondary | ICD-10-CM | POA: Diagnosis not present

## 2021-10-21 DIAGNOSIS — I739 Peripheral vascular disease, unspecified: Secondary | ICD-10-CM | POA: Diagnosis not present

## 2021-10-21 DIAGNOSIS — M2012 Hallux valgus (acquired), left foot: Secondary | ICD-10-CM | POA: Diagnosis not present

## 2021-10-21 DIAGNOSIS — M2011 Hallux valgus (acquired), right foot: Secondary | ICD-10-CM | POA: Diagnosis not present

## 2021-10-21 DIAGNOSIS — M2042 Other hammer toe(s) (acquired), left foot: Secondary | ICD-10-CM | POA: Diagnosis not present

## 2021-10-21 DIAGNOSIS — M2041 Other hammer toe(s) (acquired), right foot: Secondary | ICD-10-CM | POA: Diagnosis not present

## 2021-10-21 DIAGNOSIS — B351 Tinea unguium: Secondary | ICD-10-CM | POA: Diagnosis not present

## 2021-10-21 LAB — CBC WITH DIFFERENTIAL (CANCER CENTER ONLY)
Abs Immature Granulocytes: 0.01 10*3/uL (ref 0.00–0.07)
Basophils Absolute: 0.1 10*3/uL (ref 0.0–0.1)
Basophils Relative: 0 %
Eosinophils Absolute: 0.3 10*3/uL (ref 0.0–0.5)
Eosinophils Relative: 2 %
HCT: 42.9 % (ref 39.0–52.0)
Hemoglobin: 14.2 g/dL (ref 13.0–17.0)
Immature Granulocytes: 0 %
Lymphocytes Relative: 68 %
Lymphs Abs: 9.5 10*3/uL — ABNORMAL HIGH (ref 0.7–4.0)
MCH: 29.5 pg (ref 26.0–34.0)
MCHC: 33.1 g/dL (ref 30.0–36.0)
MCV: 89 fL (ref 80.0–100.0)
Monocytes Absolute: 0.6 10*3/uL (ref 0.1–1.0)
Monocytes Relative: 4 %
Neutro Abs: 3.7 10*3/uL (ref 1.7–7.7)
Neutrophils Relative %: 26 %
Platelet Count: 232 10*3/uL (ref 150–400)
RBC: 4.82 MIL/uL (ref 4.22–5.81)
RDW: 13.1 % (ref 11.5–15.5)
Smear Review: NORMAL
WBC Count: 14 10*3/uL — ABNORMAL HIGH (ref 4.0–10.5)
nRBC: 0 % (ref 0.0–0.2)

## 2021-10-21 LAB — CMP (CANCER CENTER ONLY)
ALT: 12 U/L (ref 0–44)
AST: 15 U/L (ref 15–41)
Albumin: 3.5 g/dL (ref 3.5–5.0)
Alkaline Phosphatase: 80 U/L (ref 38–126)
Anion gap: 8 (ref 5–15)
BUN: 18 mg/dL (ref 8–23)
CO2: 25 mmol/L (ref 22–32)
Calcium: 8.6 mg/dL — ABNORMAL LOW (ref 8.9–10.3)
Chloride: 110 mmol/L (ref 98–111)
Creatinine: 0.93 mg/dL (ref 0.61–1.24)
GFR, Estimated: >60 mL/min (ref >60)
Glucose, Bld: 127 mg/dL — ABNORMAL HIGH (ref 70–99)
Potassium: 3.9 mmol/L (ref 3.5–5.1)
Sodium: 143 mmol/L (ref 135–145)
Total Bilirubin: 0.3 mg/dL (ref 0.3–1.2)
Total Protein: 6.3 g/dL — ABNORMAL LOW (ref 6.5–8.1)

## 2021-10-21 LAB — LACTATE DEHYDROGENASE: LDH: 135 U/L (ref 98–192)

## 2021-10-27 NOTE — Progress Notes (Signed)
HEMATOLOGY/ONCOLOGY CONSULTATION NOTE  Date of Service: 10/27/2021  Patient Care Team: Shawnee Knapp, MD as PCP - General (Family Medicine) Linus Mako, MD as PCP - Cardiology Monna Fam, MD as Consulting Physician (Ophthalmology) Wonda Horner, MD as Consulting Physician (Gastroenterology)  CHIEF COMPLAINTS/PURPOSE OF CONSULTATION:  Follow-up for continued evaluation and management of CLL  HISTORY OF PRESENTING ILLNESS:  Please see previous note for details  INTERVAL HISTORY:  Dominic Butler is a wonderful 80 y.o. male who is here for follow-up of CLL. He notes no acute new symptoms since his last clinic visit. Energy levels are stable and he is continuing to work.  No new acute symptoms.  No fever no chills no night sweats no new lumps or bumps noted.  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, had seen urology and done all the studies, no known cause   Cancer ([place])    Prostate cancer status post radiation seeds   Elevated prostate specific antigen (PSA)    has had seed implants and also seen by [facility]   Neuromuscular disorder [facility])     SURGICAL HISTORY: Past Surgical History:  Procedure Laterality Date   APPENDECTOMY     CHOLECYSTECTOMY     INSERTION PROSTATE RADIATION SEED     SHOULDER SURGERY      SOCIAL HISTORY: Social History   Socioeconomic History   Marital status: Divorced    Spouse name: Not on file   Number of children: Not on file   Years of education: Not on file   Highest education level: Not on file  Occupational History   Not on file  Tobacco Use   Smoking status: Every Day    Types: Pipe   Smokeless tobacco: Never  Substance and Sexual Activity   Alcohol use: No   Drug use: No   Sexual activity:  Not on file  Other Topics Concern   Not on file  Social History Narrative   Not on file   Social Determinants of Health   Financial Resource Strain: Not on file  Food Insecurity: Not on file  Transportation Needs: Not on file  Physical Activity: Not on file  Stress: Not on file  Social Connections: Not on file  Intimate Partner Violence: Not on file    FAMILY HISTORY: Family History  Problem Relation Age of Onset   Heart attack Mother     ALLERGIES:  has No Known Allergies.  MEDICATIONS:  Current Outpatient Medications  Medication Sig Dispense Refill   amLODipine (NORVASC) 10 MG tablet Take 1 tablet (10 mg total) by mouth daily. 90 tablet 3   atorvastatin (LIPITOR) 20 MG tablet Take 1 tablet (20 mg total) by mouth daily. 90 tablet 3   No current facility-administered medications for this visit.    REVIEW OF SYSTEMS:    GENERAL:alert, in no acute distress and comfortable SKIN: no acute rashes, no significant lesions EYES: conjunctiva are pink and non-injected, sclera anicteric OROPHARYNX: MMM, no exudates, no oropharyngeal erythema or ulceration NECK: supple, no JVD LYMPH:  no palpable lymphadenopathy in the cervical, axillary or inguinal regions LUNGS: clear to auscultation b/l with normal respiratory effort HEART: regular rate & rhythm ABDOMEN:  normoactive bowel sounds , non tender, not  distended. Extremity: no pedal edema PSYCH: alert & oriented x 3 with fluent speech NEURO: no focal motor/sensory deficits   PHYSICAL EXAMINATION: ECOG PERFORMANCE STATUS: 0 - Asymptomatic  . Vitals:   10/21/21 1118  BP: (!) 161/63  Pulse: (!) 45  Temp: (!) 97.5 F (36.4 C)  SpO2: 95%   Filed Weights   10/21/21 1118  Weight: 166 lb 1.6 oz (75.3 kg)   .Body mass index is 24.53 kg/m.   NAD.Marland Kitchen GENERAL:alert, in no acute distress and comfortable SKIN: no acute rashes, no significant lesions EYES: conjunctiva are pink and non-injected, sclera anicteric OROPHARYNX:  MMM, no exudates, no oropharyngeal erythema or ulceration NECK: supple, no JVD LYMPH:  no palpable lymphadenopathy in the cervical, axillary or inguinal regions LUNGS: clear to auscultation b/l with normal respiratory effort HEART: regular rate & rhythm ABDOMEN:  normoactive bowel sounds , non tender, not distended. Extremity: no pedal edema PSYCH: alert & oriented x 3 with fluent speech NEURO: no focal motor/sensory deficits   LABORATORY DATA:  I have reviewed the data as listed  . CBC Latest Ref Rng & Units 10/21/2021 04/21/2021 12/02/2020  WBC 4.0 - 10.5 K/uL 14.0(H) 13.8(H) 13.1(H)  Hemoglobin 13.0 - 17.0 g/dL 14.2 13.9 14.9  Hematocrit 39.0 - 52.0 % 42.9 42.4 46.0  Platelets 150 - 400 K/uL 232 247 256    . CMP Latest Ref Rng & Units 10/21/2021 04/21/2021 12/02/2020  Glucose 70 - 99 mg/dL 127(H) 98 106(H)  BUN 8 - 23 mg/dL 18 22 19   Creatinine 0.61 - 1.24 mg/dL 0.93 0.93 1.02  Sodium 135 - 145 mmol/L 143 142 141  Potassium 3.5 - 5.1 mmol/L 3.9 4.1 4.5  Chloride 98 - 111 mmol/L 110 108 105  CO2 22 - 32 mmol/L 25 24 29   Calcium 8.9 - 10.3 mg/dL 8.6(L) 9.2 9.3  Total Protein 6.5 - 8.1 g/dL 6.3(L) 6.8 7.1  Total Bilirubin 0.3 - 1.2 mg/dL 0.3 0.5 0.5  Alkaline Phos 38 - 126 U/L 80 71 72  AST 15 - 41 U/L 15 15 16   ALT 0 - 44 U/L 12 9 10    . 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    RADIOGRAPHIC STUDIES: I have personally reviewed the radiological images as listed and agreed with the findings in the report. No results found.  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 -Patient has no new symptoms suggestive of CLL progression.  No significant palpable lymphadenopathy or hepatosplenomegaly. -No indication for treatment of the patient's CLL at this time. -Was recommended to stay up-to-date with her is necessary vaccinations including flu shot, COVID-19 bilateral and booster vaccine, shingles vaccine, pneumonia vaccines etc. Return to clinic with Dr. Irene Limbo in 6 months with labs FOLLOW UP: RTC with Dr Irene Limbo  with labs in 6 months  All of the patient's questions were answered with apparent satisfaction. The patient knows to call the clinic with any problems, questions or concerns.    Sullivan Lone MD MS AAHIVMS [facility] Hematology/Oncology Physician [facility]  I, Reinaldo Raddle, am acting as scribe for Dr. Sullivan Lone, MD.    .I have reviewed the above documentation for accuracy and completeness, and I agree with the above. Brunetta Genera MD

## 2021-11-21 ENCOUNTER — Other Ambulatory Visit: Payer: Self-pay | Admitting: Cardiology

## 2022-04-19 ENCOUNTER — Telehealth: Payer: Self-pay | Admitting: Hematology

## 2022-04-19 NOTE — Telephone Encounter (Signed)
R/s per 5/30 in basket, pt has been called and confirmed

## 2022-04-21 ENCOUNTER — Inpatient Hospital Stay: Payer: Medicare Other

## 2022-04-21 ENCOUNTER — Inpatient Hospital Stay: Payer: Medicare Other | Admitting: Hematology

## 2022-05-02 ENCOUNTER — Other Ambulatory Visit: Payer: Self-pay

## 2022-05-02 ENCOUNTER — Inpatient Hospital Stay: Payer: Medicare Other | Attending: Hematology

## 2022-05-02 ENCOUNTER — Inpatient Hospital Stay (HOSPITAL_BASED_OUTPATIENT_CLINIC_OR_DEPARTMENT_OTHER): Payer: Medicare Other | Admitting: Hematology

## 2022-05-02 VITALS — BP 141/61 | HR 51 | Temp 98.3°F | Wt 165.8 lb

## 2022-05-02 DIAGNOSIS — C9110 Chronic lymphocytic leukemia of B-cell type not having achieved remission: Secondary | ICD-10-CM

## 2022-05-02 LAB — CMP (CANCER CENTER ONLY)
ALT: 10 U/L (ref 0–44)
AST: 15 U/L (ref 15–41)
Albumin: 3.8 g/dL (ref 3.5–5.0)
Alkaline Phosphatase: 66 U/L (ref 38–126)
Anion gap: 4 — ABNORMAL LOW (ref 5–15)
BUN: 20 mg/dL (ref 8–23)
CO2: 29 mmol/L (ref 22–32)
Calcium: 9.3 mg/dL (ref 8.9–10.3)
Chloride: 106 mmol/L (ref 98–111)
Creatinine: 0.98 mg/dL (ref 0.61–1.24)
GFR, Estimated: >60 mL/min (ref >60)
Glucose, Bld: 123 mg/dL — ABNORMAL HIGH (ref 70–99)
Potassium: 4.2 mmol/L (ref 3.5–5.1)
Sodium: 139 mmol/L (ref 135–145)
Total Bilirubin: 0.3 mg/dL (ref 0.3–1.2)
Total Protein: 6.4 g/dL — ABNORMAL LOW (ref 6.5–8.1)

## 2022-05-02 LAB — CBC WITH DIFFERENTIAL (CANCER CENTER ONLY)
Abs Immature Granulocytes: 0.04 10*3/uL (ref 0.00–0.07)
Basophils Absolute: 0 10*3/uL (ref 0.0–0.1)
Basophils Relative: 0 %
Eosinophils Absolute: 0.3 10*3/uL (ref 0.0–0.5)
Eosinophils Relative: 2 %
HCT: 43 % (ref 39.0–52.0)
Hemoglobin: 14.4 g/dL (ref 13.0–17.0)
Immature Granulocytes: 0 %
Lymphocytes Relative: 65 %
Lymphs Abs: 7.9 10*3/uL — ABNORMAL HIGH (ref 0.7–4.0)
MCH: 29.8 pg (ref 26.0–34.0)
MCHC: 33.5 g/dL (ref 30.0–36.0)
MCV: 88.8 fL (ref 80.0–100.0)
Monocytes Absolute: 0.7 10*3/uL (ref 0.1–1.0)
Monocytes Relative: 6 %
Neutro Abs: 3.3 10*3/uL (ref 1.7–7.7)
Neutrophils Relative %: 27 %
Platelet Count: 239 10*3/uL (ref 150–400)
RBC: 4.84 MIL/uL (ref 4.22–5.81)
RDW: 13.1 % (ref 11.5–15.5)
Smear Review: NORMAL
WBC Count: 12.3 10*3/uL — ABNORMAL HIGH (ref 4.0–10.5)
nRBC: 0 % (ref 0.0–0.2)

## 2022-05-02 LAB — LACTATE DEHYDROGENASE: LDH: 133 U/L (ref 98–192)

## 2022-05-02 NOTE — Progress Notes (Signed)
HEMATOLOGY/ONCOLOGY CLINIC NOTE  Date of Service: 05/04/2022  Patient Care Team: Shawnee Knapp, MD as PCP - General (Family Medicine) Linus Mako, MD as PCP - Cardiology Monna Fam, MD as Consulting Physician (Ophthalmology) Wonda Horner, MD as Consulting Physician (Gastroenterology)  CHIEF COMPLAINTS/PURPOSE OF CONSULTATION:  Follow-up for continued evaluation and management of CLL  HISTORY OF PRESENTING ILLNESS:  Please see previous note for details  INTERVAL HISTORY:  Dominic Butler is a wonderful 81 y.o. male who is here for follow-up of CLL. He reports He is doing well with no new symptoms or concerns.  He reports that he is currently seeking a new PCP.  He reports his energy levels have been good.  He notes he is maintaining an active lifestyle.  No fever, chills, night sweats. No new lumps, bumps, or lesions/rashes. No other new or acute focal symptoms.  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, had seen urology and done all the studies, no known cause   Cancer ([place])    Prostate cancer status post radiation seeds   Elevated prostate specific antigen (PSA)    has had seed implants and also seen by [facility]   Neuromuscular disorder [facility])     SURGICAL HISTORY: Past Surgical History:  Procedure Laterality Date   APPENDECTOMY     CHOLECYSTECTOMY     INSERTION PROSTATE RADIATION SEED     SHOULDER SURGERY      SOCIAL HISTORY: Social History   Socioeconomic History   Marital status: Divorced    Spouse name: Not on file   Number of children: Not on file   Years of education: Not on file   Highest education level: Not on file  Occupational History   Not on file  Tobacco Use   Smoking status: Every Day    Types:  Pipe   Smokeless tobacco: Never  Substance and Sexual Activity   Alcohol use: No   Drug use: No   Sexual activity: Not on file  Other Topics Concern   Not on file  Social History Narrative   Not on file   Social Determinants of Health   Financial Resource Strain: Not on file  Food Insecurity: Not on file  Transportation Needs: Not on file  Physical Activity: Not on file  Stress: Not on file  Social Connections: Not on file  Intimate Partner Violence: Not on file    FAMILY HISTORY: Family History  Problem Relation Age of Onset   Heart attack Mother     ALLERGIES:  has No Known Allergies.  MEDICATIONS:  Current Outpatient Medications  Medication Sig Dispense Refill   amLODipine (NORVASC) 10 MG tablet Take 1 tablet by mouth once daily 90 tablet 3   atorvastatin (LIPITOR) 20 MG tablet Take 1 tablet (20 mg total) by mouth daily. 90 tablet 3   No current facility-administered medications for this visit.    REVIEW OF SYSTEMS:   10 Point review of Systems was done is negative except as noted above.  PHYSICAL EXAMINATION: ECOG PERFORMANCE STATUS: 0 - Asymptomatic  . Vitals:   05/02/22 0915  BP: (!) 141/61  Pulse: (!) 51  Temp: 98.3 F (36.8 C)  SpO2: 93%    Filed Weights  05/02/22 0915  Weight: 165 lb 12.8 oz (75.2 kg)    .Body mass index is 24.48 kg/m.  NAD GENERAL:alert, in no acute distress and comfortable SKIN: no acute rashes, no significant lesions EYES: conjunctiva are pink and non-injected, sclera anicteric NECK: supple, no JVD LYMPH:  no palpable lymphadenopathy in the cervical, axillary or inguinal regions LUNGS: clear to auscultation b/l with normal respiratory effort HEART: regular rate & rhythm ABDOMEN:  normoactive bowel sounds , non tender, not distended. Extremity: no pedal edema PSYCH: alert & oriented x 3 with fluent speech NEURO: no focal motor/sensory deficits   LABORATORY DATA:  I have reviewed the data as listed  .     Latest Ref Rng & Units 05/02/2022    9:03 AM 10/21/2021    9:54 AM 04/21/2021    2:25 PM  CBC  WBC 4.0 - 10.5 K/uL 12.3  14.0  13.8   Hemoglobin 13.0 - 17.0 g/dL 14.4  14.2  13.9   Hematocrit 39.0 - 52.0 % 43.0  42.9  42.4   Platelets 150 - 400 K/uL 239  232  247     .    Latest Ref Rng & Units 05/02/2022    9:03 AM 10/21/2021    9:54 AM 04/21/2021    2:25 PM  CMP  Glucose 70 - 99 mg/dL 123  127  98   BUN 8 - 23 mg/dL '20  18  22   '$ Creatinine 0.61 - 1.24 mg/dL 0.98  0.93  0.93   Sodium 135 - 145 mmol/L 139  143  142   Potassium 3.5 - 5.1 mmol/L 4.2  3.9  4.1   Chloride 98 - 111 mmol/L 106  110  108   CO2 22 - 32 mmol/L '29  25  24   '$ Calcium 8.9 - 10.3 mg/dL 9.3  8.6  9.2   Total Protein 6.5 - 8.1 g/dL 6.4  6.3  6.8   Total Bilirubin 0.3 - 1.2 mg/dL 0.3  0.3  0.5   Alkaline Phos 38 - 126 U/L 66  80  71   AST 15 - 41 U/L '15  15  15   '$ ALT 0 - 44 U/L '10  12  9    '$ . 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    RADIOGRAPHIC STUDIES: I have personally reviewed the radiological images as listed and agreed with the findings in the report. No results found.  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 -Patient has no new symptoms suggestive of CLL progression.  No significant palpable lymphadenopathy or hepatosplenomegaly. -No indication for treatment of the patient's CLL at this time. RTC in 6 months with labs  FOLLOW UP: RTC with Dr Irene Limbo with labs in 6 months   .  The total time spent in the appointment was 20 minutes* .  All of the patient's questions were answered with apparent satisfaction. The patient knows to call the clinic with any problems, questions or concerns.   Sullivan Lone MD MS AAHIVMS [facility] Hematology/Oncology Physician [facility]  .*Total Encounter Time as defined by the Centers for Medicare and Medicaid Services includes, in addition to the face-to-face time of a patient visit (documented in the note above) non-face-to-face time: obtaining and reviewing outside history, ordering and reviewing medications, tests or procedures, care coordination (communications with other health care professionals or caregivers) and documentation in the medical record.   I, Melene Muller, am acting as scribe for Dr. Sullivan Lone, MD. .I have reviewed the above documentation for accuracy and completeness, and I agree with the above. Brunetta Genera MD

## 2022-05-10 DIAGNOSIS — C44319 Basal cell carcinoma of skin of other parts of face: Secondary | ICD-10-CM | POA: Diagnosis not present

## 2022-05-10 DIAGNOSIS — L905 Scar conditions and fibrosis of skin: Secondary | ICD-10-CM | POA: Diagnosis not present

## 2022-05-10 DIAGNOSIS — D485 Neoplasm of uncertain behavior of skin: Secondary | ICD-10-CM | POA: Diagnosis not present

## 2022-05-19 DIAGNOSIS — H0102B Squamous blepharitis left eye, upper and lower eyelids: Secondary | ICD-10-CM | POA: Diagnosis not present

## 2022-05-19 DIAGNOSIS — H11041 Peripheral pterygium, stationary, right eye: Secondary | ICD-10-CM | POA: Diagnosis not present

## 2022-05-19 DIAGNOSIS — H35033 Hypertensive retinopathy, bilateral: Secondary | ICD-10-CM | POA: Diagnosis not present

## 2022-05-19 DIAGNOSIS — H35363 Drusen (degenerative) of macula, bilateral: Secondary | ICD-10-CM | POA: Diagnosis not present

## 2022-06-27 DIAGNOSIS — C44319 Basal cell carcinoma of skin of other parts of face: Secondary | ICD-10-CM | POA: Diagnosis not present

## 2022-07-26 DIAGNOSIS — C44319 Basal cell carcinoma of skin of other parts of face: Secondary | ICD-10-CM | POA: Diagnosis not present

## 2022-07-26 DIAGNOSIS — Z5189 Encounter for other specified aftercare: Secondary | ICD-10-CM | POA: Diagnosis not present

## 2022-08-25 ENCOUNTER — Encounter: Payer: Self-pay | Admitting: Cardiology

## 2022-08-25 MED ORDER — AMLODIPINE BESYLATE 10 MG PO TABS: 10.0000 mg | ORAL_TABLET | Freq: Every day | ORAL | 0 refills | Status: DC

## 2022-10-31 DIAGNOSIS — K5289 Other specified noninfective gastroenteritis and colitis: Secondary | ICD-10-CM | POA: Diagnosis not present

## 2022-11-01 ENCOUNTER — Other Ambulatory Visit: Payer: Medicare Other

## 2022-11-01 ENCOUNTER — Ambulatory Visit: Payer: Medicare Other | Admitting: Hematology

## 2022-11-03 DIAGNOSIS — L03012 Cellulitis of left finger: Secondary | ICD-10-CM | POA: Diagnosis not present

## 2022-11-03 DIAGNOSIS — S61032A Puncture wound without foreign body of left thumb without damage to nail, initial encounter: Secondary | ICD-10-CM | POA: Diagnosis not present

## 2022-11-16 ENCOUNTER — Ambulatory Visit: Payer: Medicare Other | Admitting: Hematology

## 2022-11-16 ENCOUNTER — Other Ambulatory Visit: Payer: Medicare Other

## 2022-11-22 ENCOUNTER — Ambulatory Visit: Payer: Medicare Other | Admitting: Hematology

## 2022-11-22 ENCOUNTER — Other Ambulatory Visit: Payer: Medicare Other

## 2022-11-22 NOTE — Progress Notes (Signed)
Cardiology Office Note:    Date:  11/25/2022   ID:  Dominic Butler, Dominic Butler [birth year], MRN [id number]  PCP:  Shawnee Knapp, MD  Cardiologist:  Linus Mako, MD  Electrophysiologist:  None   Referring MD: Shawnee Knapp, MD   Chief Complaint  Patient presents with   Aortic Stenosis     History of Present Illness:    Dominic Butler is a 82 y.o. male with a hx of aortic stenosis, prostate cancer, CLL, hypertension who presents for follow-up.  He was referred by Dr. Brigitte Pulse for evaluation of abnormal EKG and heart murmur, initially seen on 07/24/2020.  He denies any chest pain or dyspnea.  Reports he does yard work every day and he denies any exertional symptoms.  Denies any lightheadedness or syncope recently.  Does report he had an episode of lightheadedness while driving 3 years ago, but none since.  He denies any palpitations or lower extremity edema.  Smokes pipe every day.  Mother died of MI at age 38.  Echocardiogram on 08/11/2020 showed normal biventricular function, moderate LVH, grade 2 diastolic dysfunction, mild aortic stenosis.  Zio patch x7 days showed 12 episodes of SVT, longest lasting 13 seconds.  Since last clinic visit, he reports he is doing well.  Denies any chest pain, dyspnea, lightheadedness, syncope, lower extremity edema, or palpitations.  He does yard work for exercise.  Continues to smoke a pipe daily.  Did not take his amlodipine today.    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     Allergies:   Patient has no known allergies.   Social History   Socioeconomic History   Marital status: Divorced    Spouse name: Not on file   Number of children: Not on file   Years of education: Not on file   Highest education level: Not on file  Occupational History   Not on file  Tobacco Use   Smoking status: Every Day    Types: Pipe   Smokeless tobacco: Never  Substance and Sexual Activity   Alcohol use: No   Drug use: No   Sexual activity: Not on file  Other Topics Concern   Not on file  Social History Narrative   Not on file   Social Determinants of Health   Financial Resource Strain: Not on file  Food Insecurity: Not on file  Transportation Needs: Not on file  Physical Activity: Not on file  Stress: Not on file  Social Connections: Not on file     Family History: The patient's family history includes Heart attack in his mother.  ROS:  Please see the history of present illness.     All other systems reviewed and are negative.  EKGs/Labs/Other Studies Reviewed:    The following studies were reviewed today:   EKG:   10/12/21: Sinus bradycardia, rate 56, right bundle branch block, left anterior fascicular block 11/25/2022: Normal sinus rhythm, right bundle branch block, left axis deviation, rate 60  Recent Labs: 05/02/2022: ALT 10; BUN 20; Creatinine 0.98; Hemoglobin 14.4; Platelet Count 239; Potassium 4.2; Sodium 139  Recent Lipid Panel    Component Value Date/Time   CHOL 156 10/12/2021 1643   TRIG 47 10/12/2021 1643   HDL 71 10/12/2021 1643   CHOLHDL 2.2 10/12/2021 1643   CHOLHDL 1.9 02/20/2015 1021   VLDL 9 02/20/2015 1021   LDLCALC 75 10/12/2021 1643    Physical Exam:    VS:  BP (!) 155/62 (BP Location:  Left Arm, Patient Position: Sitting, Cuff Size: Normal)   Pulse 60   Ht '5\' 9"'$  (1.753 m)   Wt 164 lb (74.4 kg)   SpO2 96%   BMI 24.22 kg/m     Wt Readings from Last 3 Encounters:  11/25/22 164 lb (74.4 kg)  05/02/22 165 lb 12.8 oz (75.2 kg)  10/21/21 166 lb 1.6 oz (75.3 kg)     GEN: Well nourished, well developed in no acute distress HEENT: Normal NECK: No JVD; No carotid bruits CARDIAC: braydcardic, regular, 3 out of 6 systolic murmur loudest at RUSB RESPIRATORY:  Clear to auscultation without rales, wheezing or rhonchi  ABDOMEN: Soft, non-tender, non-distended MUSCULOSKELETAL:  No edema; No deformity  SKIN: Warm and dry NEUROLOGIC:  Alert and oriented x 3 PSYCHIATRIC:  Normal affect   ASSESSMENT:    1. Aortic valve stenosis, etiology of cardiac valve disease unspecified   2. Hyperlipidemia, unspecified hyperlipidemia type   3. Essential hypertension   4. SVT (supraventricular tachycardia)   5. Tobacco use      PLAN:     Aortic stenosis: Mild on echocardiogram 08/11/2020.  Will check echo to monitor  Bradycardia: Sinus bradycardia to 40s.  Reports remote episode of lightheadedness, but none recently.  No bradyarrhythmias on Zio patch on 08/2020  SVT: Zio patch x7 days on 08/13/2020 showed 12 episodes of SVT, longest lasting 13 seconds.  No symptoms reported during episodes.  Hypertension: On amlodipine 10 mg daily.  Elevated in clinic today but reports did not take his amlodipine today, will asked to check BP twice daily for next week and call with results  Tobacco use: continues to smoke pipe, not interested in quitting.  Counseled on the risks of tobacco use and cessation strongly recommended  Hyperlipidemia: LDL 90 on 11/26/2020, 10-year ASCVD risk score 30%.  Recommended starting statin, he initially declined wanting to try lifestyle changes.  Repeat lipid panel 07/2021 showed LDL 88.  Started atorvastatin 20 mg daily.  LDL 75 on 10/12/2021. -He reports he stopped  taking the statin because was concerned about possible side effects.  Will check calcium score, if elevated would recommend restarting statin  CLL: Follows with oncology, no indication for treatment at this time  RTC in 6 months   Medication Adjustments/Labs and Tests Ordered: Current medicines are reviewed at length with the patient today.  Concerns regarding medicines are outlined above.  Orders Placed This Encounter  Procedures   CT CARDIAC SCORING (SELF PAY ONLY)   EKG 12-Lead   ECHOCARDIOGRAM COMPLETE    No orders of the defined types were placed in this encounter.    Patient  Instructions  Medication Instructions:  The current medical regimen is effective;  continue present plan and medications.  *If you need a refill on your cardiac medications before your next appointment, please call your pharmacy*   Testing/Procedures: Echocardiogram - Your physician has requested that you have an echocardiogram. Echocardiography is a painless test that uses sound waves to create images of your heart. It provides your doctor with information about the size and shape of your heart and how well your heart's chambers and valves are working. This procedure takes approximately one hour. There are no restrictions for this procedure.   CALCIUM SCORE   Follow-Up: At [facility], you and your health needs are our priority.  As part of our continuing mission to provide you with exceptional heart care, we have created designated Provider Care Teams.  These Care Teams include your primary Cardiologist (physician) and Advanced Practice Providers (APPs -  Physician Assistants and Nurse Practitioners) who all work together to provide you with the care you need, when you need it.  We recommend signing up for the patient portal called "MyChart".  Sign up information is provided on this After Visit Summary.  MyChart is used to connect with patients for Virtual Visits (Telemedicine).  Patients are  able to view lab/test results, encounter notes, upcoming appointments, etc.  Non-urgent messages can be sent to your provider as well.   To learn more about what you can do with MyChart, go to [email or web address].    Your next appointment:   6 month(s)  The format for your next appointment:   In Person  Provider:   Oswaldo Milian, MD     BP twice daily for 1 week- send [place] those readings      Signed, Donato Heinz, MD  11/25/2022 10:48 AM    [place]

## 2022-11-25 ENCOUNTER — Encounter: Payer: Self-pay | Admitting: Cardiology

## 2022-11-25 ENCOUNTER — Ambulatory Visit: Payer: Medicare Other | Attending: Cardiology | Admitting: Cardiology

## 2022-11-25 VITALS — BP 155/62 | HR 60 | Ht 69.0 in | Wt 164.0 lb

## 2022-11-25 DIAGNOSIS — Z720 Tobacco use: Secondary | ICD-10-CM | POA: Insufficient documentation

## 2022-11-25 DIAGNOSIS — E785 Hyperlipidemia, unspecified: Secondary | ICD-10-CM | POA: Diagnosis not present

## 2022-11-25 DIAGNOSIS — I471 Supraventricular tachycardia, unspecified: Secondary | ICD-10-CM

## 2022-11-25 DIAGNOSIS — I10 Essential (primary) hypertension: Secondary | ICD-10-CM

## 2022-11-25 DIAGNOSIS — I350 Nonrheumatic aortic (valve) stenosis: Secondary | ICD-10-CM

## 2022-11-25 NOTE — Patient Instructions (Addendum)
Medication Instructions:  The current medical regimen is effective;  continue present plan and medications.  *If you need a refill on your cardiac medications before your next appointment, please call your pharmacy*   Testing/Procedures: Echocardiogram - Your physician has requested that you have an echocardiogram. Echocardiography is a painless test that uses sound waves to create images of your heart. It provides your doctor with information about the size and shape of your heart and how well your heart's chambers and valves are working. This procedure takes approximately one hour. There are no restrictions for this procedure.   CALCIUM SCORE   Follow-Up: At [facility], you and your health needs are our priority.  As part of our continuing mission to provide you with exceptional heart care, we have created designated Provider Care Teams.  These Care Teams include your primary Cardiologist (physician) and Advanced Practice Providers (APPs -  Physician Assistants and Nurse Practitioners) who all work together to provide you with the care you need, when you need it.  We recommend signing up for the patient portal called "MyChart".  Sign up information is provided on this After Visit Summary.  MyChart is used to connect with patients for Virtual Visits (Telemedicine).  Patients are able to view lab/test results, encounter notes, upcoming appointments, etc.  Non-urgent messages can be sent to your provider as well.   To learn more about what you can do with MyChart, go to [email or web address].    Your next appointment:   6 month(s)  The format for your next appointment:   In Person  Provider:   Oswaldo Milian, MD     BP twice daily for 1 week- send [place] those readings

## 2022-11-28 DIAGNOSIS — D72819 Decreased white blood cell count, unspecified: Secondary | ICD-10-CM | POA: Diagnosis not present

## 2022-11-28 DIAGNOSIS — Z7689 Persons encountering health services in other specified circumstances: Secondary | ICD-10-CM | POA: Diagnosis not present

## 2022-11-28 DIAGNOSIS — I10 Essential (primary) hypertension: Secondary | ICD-10-CM | POA: Diagnosis not present

## 2022-11-28 DIAGNOSIS — Z8546 Personal history of malignant neoplasm of prostate: Secondary | ICD-10-CM | POA: Diagnosis not present

## 2022-11-29 ENCOUNTER — Other Ambulatory Visit: Payer: Self-pay

## 2022-11-29 DIAGNOSIS — C9110 Chronic lymphocytic leukemia of B-cell type not having achieved remission: Secondary | ICD-10-CM

## 2022-11-30 ENCOUNTER — Other Ambulatory Visit: Payer: Self-pay

## 2022-11-30 ENCOUNTER — Inpatient Hospital Stay: Payer: Medicare Other | Attending: Hematology

## 2022-11-30 ENCOUNTER — Inpatient Hospital Stay (HOSPITAL_BASED_OUTPATIENT_CLINIC_OR_DEPARTMENT_OTHER): Payer: Medicare Other | Admitting: Hematology

## 2022-11-30 VITALS — BP 145/74 | HR 55 | Temp 97.7°F | Resp 18 | Wt 160.4 lb

## 2022-11-30 DIAGNOSIS — C9110 Chronic lymphocytic leukemia of B-cell type not having achieved remission: Secondary | ICD-10-CM | POA: Diagnosis not present

## 2022-11-30 LAB — CMP (CANCER CENTER ONLY)
ALT: 12 U/L (ref 0–44)
AST: 17 U/L (ref 15–41)
Albumin: 4.2 g/dL (ref 3.5–5.0)
Alkaline Phosphatase: 72 U/L (ref 38–126)
Anion gap: 6 (ref 5–15)
BUN: 16 mg/dL (ref 8–23)
CO2: 30 mmol/L (ref 22–32)
Calcium: 9.2 mg/dL (ref 8.9–10.3)
Chloride: 102 mmol/L (ref 98–111)
Creatinine: 0.99 mg/dL (ref 0.61–1.24)
GFR, Estimated: >60 mL/min (ref >60)
Glucose, Bld: 98 mg/dL (ref 70–99)
Potassium: 4.6 mmol/L (ref 3.5–5.1)
Sodium: 138 mmol/L (ref 135–145)
Total Bilirubin: 0.3 mg/dL (ref 0.3–1.2)
Total Protein: 7.3 g/dL (ref 6.5–8.1)

## 2022-11-30 LAB — CBC WITH DIFFERENTIAL (CANCER CENTER ONLY)
Abs Immature Granulocytes: 0.03 10*3/uL (ref 0.00–0.07)
Basophils Absolute: 0.1 10*3/uL (ref 0.0–0.1)
Basophils Relative: 1 %
Eosinophils Absolute: 0.2 10*3/uL (ref 0.0–0.5)
Eosinophils Relative: 1 %
HCT: 45 % (ref 39.0–52.0)
Hemoglobin: 15 g/dL (ref 13.0–17.0)
Immature Granulocytes: 0 %
Lymphocytes Relative: 59 %
Lymphs Abs: 7.7 10*3/uL — ABNORMAL HIGH (ref 0.7–4.0)
MCH: 29.7 pg (ref 26.0–34.0)
MCHC: 33.3 g/dL (ref 30.0–36.0)
MCV: 89.1 fL (ref 80.0–100.0)
Monocytes Absolute: 0.8 10*3/uL (ref 0.1–1.0)
Monocytes Relative: 6 %
Neutro Abs: 4.2 10*3/uL (ref 1.7–7.7)
Neutrophils Relative %: 33 %
Platelet Count: 233 10*3/uL (ref 150–400)
RBC: 5.05 MIL/uL (ref 4.22–5.81)
RDW: 13.2 % (ref 11.5–15.5)
Smear Review: NORMAL
WBC Count: 13 10*3/uL — ABNORMAL HIGH (ref 4.0–10.5)
nRBC: 0 % (ref 0.0–0.2)

## 2022-11-30 LAB — LACTATE DEHYDROGENASE: LDH: 136 U/L (ref 98–192)

## 2022-11-30 NOTE — Progress Notes (Signed)
HEMATOLOGY/ONCOLOGY CLINIC NOTE  Date of Service: 11/30/2022  Patient Care Team: Shawnee Knapp, MD as PCP - General (Family Medicine) Linus Mako, MD as PCP - Cardiology Monna Fam, MD as Consulting Physician (Ophthalmology) Wonda Horner, MD as Consulting Physician (Gastroenterology)  CHIEF COMPLAINTS/PURPOSE OF CONSULTATION:  Follow-up for continued evaluation and management of CLL  HISTORY OF PRESENTING ILLNESS:  Please see previous note for details  INTERVAL HISTORY:  Dominic Butler is a wonderful 82 y.o. male who is here for follow-up of CLL.  Patient was last seen by me on 05/02/2022 and was doing well without any medical concerns.   Patient is accompanied by his daughter during this visit. He reports he has been doing well without any new medical concerns since our last visit. He notes he has been staying physically active.   Patient denies any new infection issues, fever, chills, night sweats, fatigue, new lumps/bumps, unexpected weight loss, back pain, chest pain, abdominal pain, or leg swelling. Patient notes he did lose around 3 lbs in last month, but it was not unexpected.   Patient notes he still smokes tobacco pipe everyday since he was 82 years old.   He had a little bump near his right eye removed in August of 2023.   Patient notes he is in the process of getting a new PCP.   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, had seen urology and done all the studies, no known cause   Cancer ([place])    Prostate cancer status post radiation seeds   Elevated prostate specific antigen (PSA)    has had seed implants and also seen by [facility]   Neuromuscular disorder [facility])     SURGICAL HISTORY: Past Surgical History:  Procedure Laterality Date   APPENDECTOMY     CHOLECYSTECTOMY     INSERTION PROSTATE RADIATION SEED     SHOULDER SURGERY      SOCIAL  HISTORY: Social History   Socioeconomic History   Marital status: Divorced    Spouse name: Not on file   Number of children: Not on file   Years of education: Not on file   Highest education level: Not on file  Occupational History   Not on file  Tobacco Use   Smoking status: Every Day    Types: Pipe   Smokeless tobacco: Never  Substance and Sexual Activity   Alcohol use: No   Drug use: No   Sexual activity: Not on file  Other Topics Concern   Not on file  Social History Narrative   Not on file   Social Determinants of Health   Financial Resource Strain: Not on file  Food Insecurity: Not on file  Transportation Needs: Not on file  Physical Activity: Not on file  Stress: Not on file  Social Connections: Not on file  Intimate Partner Violence: Not on file    FAMILY HISTORY: Family History  Problem Relation Age of Onset   Heart attack Mother     ALLERGIES:  has No Known Allergies.  MEDICATIONS:  Current Outpatient Medications  Medication Sig Dispense Refill   amLODipine (NORVASC) 10 MG tablet Take 1 tablet (10 mg total) by mouth daily. 90 tablet 0   atorvastatin (LIPITOR) 20 MG tablet Take 1 tablet (20 mg total) by mouth daily. (Patient not taking: Reported on 11/25/2022) 90 tablet 3   No current  facility-administered medications for this visit.    REVIEW OF SYSTEMS:   10 Point review of Systems was done is negative except as noted above.  PHYSICAL EXAMINATION: ECOG PERFORMANCE STATUS: 0 - Asymptomatic  . Vitals:   11/30/22 1425  BP: (!) 145/74  Pulse: (!) 55  Resp: 18  Temp: 97.7 F (36.5 C)  SpO2: 97%     Filed Weights   11/30/22 1425  Weight: 160 lb 6.4 oz (72.8 kg)     .Body mass index is 23.69 kg/m.  NAD GENERAL:alert, in no acute distress and comfortable SKIN: no acute rashes, no significant lesions EYES: conjunctiva are pink and non-injected, sclera anicteric NECK: supple, no JVD LYMPH:  no palpable lymphadenopathy in the cervical,  axillary or inguinal regions LUNGS: clear to auscultation b/l with normal respiratory effort HEART: regular rate & rhythm ABDOMEN:  normoactive bowel sounds , non tender, not distended. Extremity: no pedal edema PSYCH: alert & oriented x 3 with fluent speech NEURO: no focal motor/sensory deficits   LABORATORY DATA:  I have reviewed the data as listed  .    Latest Ref Rng & Units 11/30/2022    2:16 PM 05/02/2022    9:03 AM 10/21/2021    9:54 AM  CBC  WBC 4.0 - 10.5 K/uL 13.0  12.3  14.0   Hemoglobin 13.0 - 17.0 g/dL 15.0  14.4  14.2   Hematocrit 39.0 - 52.0 % 45.0  43.0  42.9   Platelets 150 - 400 K/uL 233  239  232     .    Latest Ref Rng & Units 11/30/2022    2:16 PM 05/02/2022    9:03 AM 10/21/2021    9:54 AM  CMP  Glucose 70 - 99 mg/dL 98  123  127   BUN 8 - 23 mg/dL '16  20  18   '$ Creatinine 0.61 - 1.24 mg/dL 0.99  0.98  0.93   Sodium 135 - 145 mmol/L 138  139  143   Potassium 3.5 - 5.1 mmol/L 4.6  4.2  3.9   Chloride 98 - 111 mmol/L 102  106  110   CO2 22 - 32 mmol/L '30  29  25   '$ Calcium 8.9 - 10.3 mg/dL 9.2  9.3  8.6   Total Protein 6.5 - 8.1 g/dL 7.3  6.4  6.3   Total Bilirubin 0.3 - 1.2 mg/dL 0.3  0.3  0.3   Alkaline Phos 38 - 126 U/L 72  66  80   AST 15 - 41 U/L '17  15  15   '$ ALT 0 - 44 U/L '12  10  12    '$ . 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    RADIOGRAPHIC STUDIES: I have personally reviewed the radiological images as listed and agreed with the findings in the report. No results found.  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 CBC shows slightly elevated WBC of 13.0 K. CMP is wnl -Advised patient to stay hydrated.  -Patient has no new symptoms suggestive of symptomatic CLL progression.  No significant palpable lymphadenopathy or hepatosplenomegaly. -No indication for treatment of the patient's CLL at this time. -Educated the patient and his daughter that 60 q deletion CLL could cause skin cancer and should follow up with Dermatologist.  -Advised patient to have an annual follow up visits with Dermatologist.   FOLLOW-UP: RTC with Dr Irene Limbo with labs in 6 months  The total time spent in the appointment was 20 minutes* .  All of the patient's questions were answered with apparent satisfaction. The patient knows to call the clinic with any problems, questions or concerns.   Sullivan Lone MD MS AAHIVMS [facility] Hematology/Oncology Physician [facility]  .*Total Encounter Time as defined by the Centers for Medicare and Medicaid Services includes, in addition to the face-to-face time of a patient visit (documented in the note above) non-face-to-face time: obtaining and reviewing outside history, ordering and reviewing medications, tests or procedures, care coordination (communications with other health care professionals or caregivers) and documentation in the medical record.    I, Cleda Mccreedy, am acting as a Education administrator for Sullivan Lone, MD.  .I have reviewed the above documentation for accuracy and completeness, and I agree with the above. Brunetta Genera MD

## 2022-12-06 ENCOUNTER — Other Ambulatory Visit: Payer: Self-pay | Admitting: Cardiology

## 2022-12-07 MED ORDER — AMLODIPINE BESYLATE 10 MG PO TABS: 10.0000 mg | ORAL_TABLET | Freq: Every day | ORAL | 1 refills | Status: DC

## 2023-01-04 ENCOUNTER — Ambulatory Visit (INDEPENDENT_AMBULATORY_CARE_PROVIDER_SITE_OTHER): Payer: Medicare Other

## 2023-01-04 ENCOUNTER — Ambulatory Visit (HOSPITAL_BASED_OUTPATIENT_CLINIC_OR_DEPARTMENT_OTHER)
Admission: RE | Admit: 2023-01-04 | Discharge: 2023-01-04 | Disposition: A | Discharge status: Home / Self Care | Payer: Medicare Other | Source: Ambulatory Visit | Attending: Cardiology | Admitting: Cardiology

## 2023-01-04 ENCOUNTER — Encounter (HOSPITAL_BASED_OUTPATIENT_CLINIC_OR_DEPARTMENT_OTHER): Payer: Self-pay

## 2023-01-04 DIAGNOSIS — I350 Nonrheumatic aortic (valve) stenosis: Secondary | ICD-10-CM

## 2023-01-04 DIAGNOSIS — E785 Hyperlipidemia, unspecified: Secondary | ICD-10-CM | POA: Insufficient documentation

## 2023-01-04 LAB — ECHOCARDIOGRAM COMPLETE
AR max vel: 1.91 cm2
AV Area VTI: 1.87 cm2
AV Area mean vel: 1.76 cm2
AV Mean grad: 17.7 mmHg
AV Peak grad: 33 mmHg
Ao pk vel: 2.87 m/s
Area-P 1/2: 2.36 cm2
S' Lateral: 2.69 cm

## 2023-01-05 ENCOUNTER — Other Ambulatory Visit: Payer: Self-pay | Admitting: *Deleted

## 2023-01-05 MED ORDER — ATORVASTATIN CALCIUM 20 MG PO TABS: 20.0000 mg | ORAL_TABLET | Freq: Every day | ORAL | 3 refills | Status: DC

## 2023-01-06 ENCOUNTER — Telehealth: Payer: Self-pay | Admitting: Cardiology

## 2023-01-06 NOTE — Telephone Encounter (Signed)
Radiology called  the wanted to report  the Addendum  from patient Cardiac Scoring taht was over read by Radiologist  Right lower lobe pulmonary nodule of 8 mm. Non-contrast chest CT at 6-12 months is recommended. If the nodule is stable at time of repeat CT, then future CT at 18-24 months (from today's scan) is considered optional for low-risk patients, but is recommended for high-risk patients. This recommendation follows the consensus statement: Guidelines for Management of Incidental Pulmonary Nodules Detected on CT Image   Will forward information to Dr Gardiner Rhyme for review

## 2023-01-06 NOTE — Telephone Encounter (Signed)
[facility] is calling to talk with triage about about call lab report on patient

## 2023-01-06 NOTE — Telephone Encounter (Signed)
See result note.  

## 2023-01-09 ENCOUNTER — Other Ambulatory Visit: Payer: Self-pay | Admitting: *Deleted

## 2023-01-09 DIAGNOSIS — R911 Solitary pulmonary nodule: Secondary | ICD-10-CM

## 2023-02-17 ENCOUNTER — Other Ambulatory Visit: Payer: Self-pay

## 2023-02-17 ENCOUNTER — Telehealth: Payer: Self-pay | Admitting: Cardiology

## 2023-02-17 MED ORDER — AMLODIPINE BESYLATE 10 MG PO TABS: 10.0000 mg | ORAL_TABLET | Freq: Every day | ORAL | 1 refills | Status: DC

## 2023-02-17 NOTE — Telephone Encounter (Signed)
Medication already refilled and sent to desired pharmacy. Left voicemail and notified patient.

## 2023-02-17 NOTE — Telephone Encounter (Signed)
*  STAT* If patient is at the pharmacy, call can be transferred to refill team.   1. Which medications need to be refilled? (please list name of each medication and dose if known)           amLODipine (NORVASC) 10 MG tablet     2. Which pharmacy/location (including street and city if local pharmacy) is medication to be sent to?   [place], [place]    3. Do they need a 30 day or 90 day supply? [place]

## 2023-05-18 ENCOUNTER — Telehealth: Payer: Self-pay | Admitting: Cardiology

## 2023-05-18 DIAGNOSIS — R42 Dizziness and giddiness: Secondary | ICD-10-CM | POA: Diagnosis not present

## 2023-05-18 DIAGNOSIS — R001 Bradycardia, unspecified: Secondary | ICD-10-CM | POA: Diagnosis not present

## 2023-05-18 NOTE — Telephone Encounter (Signed)
Patient states this morning was dizzy, laid down and still felt dizzy when he got up.  States the doctor at Urgent care did EKG and states he needed to follow up. He did have fluid behind ears, small amount.  Stated this could be cause of the vertigo. No nausea, nor SOB, no other issues. He is sending a copy of the EKG and his last log of BP and HR.   Please advise if needs soon appointment once review EKG and log

## 2023-05-18 NOTE — Telephone Encounter (Signed)
STAT if HR is under 50 or over 120 (normal HR is 60-100 beats per minute)  What is your heart rate?    Do you have a log of your heart rate readings (document readings)?  56, 53, 48, 52  Do you have any other symptoms?  Dizziness this morning - This morning at urgent care Dr. Lucianne Muss advised patient to follow up with Dr. Bjorn Pippin due to low HR. Patient mentions that Dr. Lucianne Muss saw some fluid build up behind his ears which may contribute to his symptoms.

## 2023-05-19 NOTE — Telephone Encounter (Signed)
Call to patient to attempt to schedule. He was unable to send EKG and notes to office.  Patient called [facility] and per Dr Bjorn Pippin OL to schedule on July 2nd at 10:40 am.  Patient is agreeable to that and scheduled

## 2023-05-19 NOTE — Telephone Encounter (Signed)
Did we receive the EKG and BP/heart rate log?

## 2023-05-22 DIAGNOSIS — H35033 Hypertensive retinopathy, bilateral: Secondary | ICD-10-CM | POA: Diagnosis not present

## 2023-05-22 DIAGNOSIS — H43393 Other vitreous opacities, bilateral: Secondary | ICD-10-CM | POA: Diagnosis not present

## 2023-05-22 DIAGNOSIS — H35363 Drusen (degenerative) of macula, bilateral: Secondary | ICD-10-CM | POA: Diagnosis not present

## 2023-05-22 DIAGNOSIS — H35373 Puckering of macula, bilateral: Secondary | ICD-10-CM | POA: Diagnosis not present

## 2023-05-22 NOTE — Progress Notes (Unsigned)
Cardiology Office Note:    Date:  05/23/2023   ID:  Doren, Apostle [birth year], MRN [id number]  PCP:  Sherren Mocha, MD  Cardiologist:  [name], MD  Electrophysiologist:  None   Referring MD: Sherren Mocha, MD   Chief Complaint  Patient presents with   Bradycardia     History of Present Illness:    Dominic Butler is a 82 y.o. male with a hx of aortic stenosis, prostate cancer, CLL, hypertension who presents for follow-up.  He was referred by Dr. Clelia Croft for evaluation of abnormal EKG and heart murmur, initially seen on 07/24/2020.  He denies any chest pain or dyspnea.  Reports he does yard work every day and he denies any exertional symptoms.  Denies any lightheadedness or syncope recently.  Does report he had an episode of lightheadedness while driving 3 years ago, but none since.  He denies any palpitations or lower extremity edema.  Smokes pipe every day.  Mother died of MI at age 29.  Echocardiogram on 08/11/2020 showed normal biventricular function, moderate LVH, grade 2 diastolic dysfunction, mild aortic stenosis.  Zio patch x7 days showed 12 episodes of SVT, longest lasting 13 seconds.  Calcium score 459 (55th percentile) on 01/04/2023.  Also noted on calcium score to have right lower lobe pulmonary nodule measuring 8 mm.  Echocardiogram 12/2022 showed EF 60 to 65%, mild LVH, normal RV function, moderate left atrial enlargement, mild right atrial enlargement, mild aortic stenosis.  Since last clinic visit, he reports he is doing okay.  Had episode of lightheadedness and blurry vision, went to urgent care and had EKG which showed sinus bradycardia, rate 46.  Denies any syncopal episodes.  Denies any chest pain, dyspnea, lightheadedness, syncope, lower extremity edema, or palpitations.  Home BP log shows BP 120s 130s over 50s to 60s, heart rate 40s to 60s.   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   atorvastatin (LIPITOR) 20 MG tablet Take 1 tablet (20 mg total) by mouth daily.     Allergies:   Patient has no known allergies.   Social History   Socioeconomic History   Marital status: Divorced    Spouse name: Not on file   Number of children: Not on file   Years of education: Not on file   Highest education level: Not on file  Occupational History   Not on file  Tobacco Use   Smoking status: Every Day    Types: Pipe   Smokeless tobacco: Never  Substance and Sexual Activity   Alcohol use: No   Drug use: No   Sexual  activity: Not on file  Other Topics Concern   Not on file  Social History Narrative   Not on file   Social Determinants of Health   Financial Resource Strain: Not on file  Food Insecurity: Not on file  Transportation Needs: Not on file  Physical Activity: Not on file  Stress: Not on file  Social Connections: Not on file     Family History: The patient's family history includes Heart attack in his mother.  ROS:   Please see the history of present illness.     All other systems reviewed and are negative.  EKGs/Labs/Other Studies Reviewed:    The following studies were reviewed today:   EKG:   10/12/21: Sinus bradycardia, rate 56, right bundle branch block, left anterior fascicular block 11/25/2022: Normal  sinus rhythm, right bundle branch block, left axis deviation, rate 60 05/23/23: SB, rate 52, RBBB, LAFB  Recent Labs: 11/30/2022: ALT 12; BUN 16; Creatinine 0.99; Hemoglobin 15.0; Platelet Count 233; Potassium 4.6; Sodium 138  Recent Lipid Panel    Component Value Date/Time   CHOL 156 10/12/2021 1643   TRIG 47 10/12/2021 1643   HDL 71 10/12/2021 1643   CHOLHDL 2.2 10/12/2021 1643   CHOLHDL 1.9 02/20/2015 1021   VLDL 9 02/20/2015 1021   LDLCALC 75 10/12/2021 1643    Physical Exam:    VS:  BP (!) 142/72   Pulse (!) 55   Ht 5\' 10"  (1.778 m)   Wt 155 lb (70.3 kg)   SpO2 94%   BMI 22.24 kg/m     Wt Readings from Last 3 Encounters:  05/23/23 155 lb (70.3 kg)  11/30/22 160 lb 6.4 oz (72.8 kg)  11/25/22 164 lb (74.4 kg)     GEN: Well nourished, well developed in no acute distress HEENT: Normal NECK: No JVD; No carotid bruits CARDIAC: braydcardic, regular, 3 out of 6 systolic murmur loudest at RUSB RESPIRATORY:  Clear to auscultation without rales, wheezing or rhonchi  ABDOMEN: Soft, non-tender, non-distended MUSCULOSKELETAL:  No edema; No deformity  SKIN: Warm and dry NEUROLOGIC:  Alert and oriented x 3 PSYCHIATRIC:  Normal affect   ASSESSMENT:    1. Bradycardia   2. Essential hypertension   3. SVT (supraventricular tachycardia)   4. Aortic valve stenosis, etiology of cardiac valve disease unspecified   5. Medication management   6. Tobacco use   7. Hyperlipidemia, unspecified hyperlipidemia type     PLAN:    Symptomatic bradycardia: Recent episode of lightheadedness/blurry vision.  Went to urgent care, EKG shows sinus bradycardia with rate 46.  EKG shows bifascicular block. -Check Zio patch x 7 days to evaluate for bradyarrhythmia -Recommend referral to EP to evaluate for PPM  Aortic stenosis: Mild on echocardiogram 12/2022.  Will monitor  SVT: Zio patch x7 days on 08/13/2020 showed 12 episodes of SVT, longest lasting 13 seconds.  No symptoms reported during  episodes.  Hypertension: On amlodipine 10 mg daily.    Tobacco use: continues to smoke pipe, not interested in quitting.  Counseled on the risks of tobacco use and cessation strongly recommended  Hyperlipidemia: LDL 90 on 11/26/2020, 10-year ASCVD risk score 30%.  Recommended starting statin, he initially declined wanting to try lifestyle changes.  Repeat lipid panel 07/2021 showed LDL 88.  Started atorvastatin 20 mg daily.  LDL 75 on 10/12/2021. -He reports he stopped taking the statin because was concerned about possible side effects.  Calcium score 459 (55th percentile) on 01/04/2023.  Restarted atorvastatin 20 mg daily.  Check lipid panel  Pulmonary nodule: right lower lobe pulmonary nodule measuring 8 mm on CT 12/2022.  Repeat chest CT recommended in 9 months  CLL: Follows with oncology, no indication for treatment at this time  RTC in 6 months   Medication Adjustments/Labs and Tests Ordered: Current medicines are reviewed at length with the patient today.  Concerns regarding medicines are outlined above.  Orders Placed This Encounter  Procedures   Comprehensive Metabolic Panel (CMET)   CBC with Differential/Platelet   Lipid Profile   TSH+T4F+T3Free   Ambulatory referral to Cardiac Electrophysiology   LONG TERM MONITOR (3-14 DAYS)   EKG 12-Lead    No orders of the defined types were placed in this encounter.    Patient Instructions  Medication Instructions:  Your physician recommends that you continue on your current medications as directed. Please refer to the Current Medication list given to you today.  *If you need a refill on your cardiac medications before your next appointment, please call your pharmacy*   Lab Work: Your physician recommends that you have labs drawn today" CMET, CBC, TSH, Lipid If you have labs (blood work) drawn today and your tests are completely normal, you will receive your results only by: MyChart Message (if you have MyChart) OR A paper copy in  the mail If you have any lab test that is abnormal or we need to change your treatment, we will call you to review the results.   Testing/Procedures: Christena Deem- Long Term Monitor Instructions  Your physician has requested you wear a ZIO patch monitor for 7 days.  This is a single patch monitor. [employer] supplies one patch monitor per enrollment. Additional stickers are not available. Please do not apply patch if you will be having a Nuclear Stress Test,  Echocardiogram, Cardiac CT, MRI, or Chest Xray during the period you would be wearing the  monitor. The patch cannot be worn during these tests. You cannot remove and re-apply the  ZIO XT patch monitor.  Your ZIO patch monitor will be mailed 3 day USPS to your address on file. It may take 3-5 days  to receive your monitor after you have been enrolled.  Once you have received your monitor, please review the enclosed instructions. Your monitor  has already been registered assigning a specific monitor serial # to you.  Billing and Patient Assistance Program Information  We have supplied [employer] with any of your insurance information on file for billing purposes. [employer] offers a sliding scale Patient Assistance Program for patients that do not have  insurance, or whose insurance does not completely cover the cost of the ZIO monitor.  You must apply for the Patient Assistance Program to qualify for this discounted rate.  To apply, please call [employer] at [phone], select option 4, select option 2, ask to apply for  Patient Assistance Program. Meredeth Ide will ask your household income, and how many people  are in your household. They will quote your out-of-pocket cost based on that information.  [employer] will also be able to set up a 67-month, interest-free payment plan if needed.  Applying the monitor   Shave hair from upper left chest.  Hold abrader disc by orange tab. Rub abrader in 40 strokes over the upper left chest as  indicated in your  monitor instructions.  Clean area with 4 enclosed alcohol pads. Let dry.  Apply patch as indicated in monitor instructions. Patch will be placed under collarbone on left  side of chest with arrow pointing  upward.  Rub patch adhesive wings for 2 minutes. Remove white label marked "1". Remove the white  label marked "2". Rub patch adhesive wings for 2 additional minutes.  While looking in a mirror, press and release button in center of patch. A small green light will  flash 3-4 times. This will be your only indicator that the monitor has been turned on.  Do not shower for the first 24 hours. You may shower after the first 24 hours.  Press the button if you feel a symptom. You will hear a small click. Record Date, Time and  Symptom in the Patient Logbook.  When you are ready to remove the patch, follow instructions on the last 2 pages of Patient  Logbook. Stick patch monitor onto the last page of Patient Logbook.  Place Patient Logbook in the blue and white box. Use locking tab on box and tape box closed  securely. The blue and white box has prepaid postage on it. Please place it in the mailbox as  soon as possible. Your physician should have your test results approximately 7 days after the  monitor has been mailed back to [facility].  Call [facility] Customer Care at [phone] if you have questions regarding  your ZIO XT patch monitor. Call them immediately if you see an orange light blinking on your  monitor.  If your monitor falls off in less than 4 days, contact our Monitor department at [phone].  If your monitor becomes loose or falls off after 4 days call [employer] at [phone] for  suggestions on securing your monitor    Follow-Up: At [facility], you and your health needs are our priority.  As part of our continuing mission to provide you with exceptional heart care, we have created designated Provider Care Teams.  These Care Teams include your primary  Cardiologist (physician) and Advanced Practice Providers (APPs -  Physician Assistants and Nurse Practitioners) who all work together to provide you with the care you need, when you need it.   Your next appointment:   4 months  Provider:   [name], MD    Signed, [name], MD  05/23/2023 12:05 PM    [facility]

## 2023-05-23 ENCOUNTER — Ambulatory Visit (INDEPENDENT_AMBULATORY_CARE_PROVIDER_SITE_OTHER): Payer: Medicare Other

## 2023-05-23 ENCOUNTER — Encounter: Payer: Self-pay | Admitting: Cardiology

## 2023-05-23 ENCOUNTER — Ambulatory Visit: Payer: Medicare Other | Attending: Cardiology | Admitting: Cardiology

## 2023-05-23 VITALS — BP 142/72 | HR 55 | Ht 70.0 in | Wt 155.0 lb

## 2023-05-23 DIAGNOSIS — E785 Hyperlipidemia, unspecified: Secondary | ICD-10-CM | POA: Diagnosis not present

## 2023-05-23 DIAGNOSIS — I471 Supraventricular tachycardia, unspecified: Secondary | ICD-10-CM | POA: Insufficient documentation

## 2023-05-23 DIAGNOSIS — R001 Bradycardia, unspecified: Secondary | ICD-10-CM | POA: Insufficient documentation

## 2023-05-23 DIAGNOSIS — I350 Nonrheumatic aortic (valve) stenosis: Secondary | ICD-10-CM | POA: Diagnosis not present

## 2023-05-23 DIAGNOSIS — Z720 Tobacco use: Secondary | ICD-10-CM | POA: Diagnosis not present

## 2023-05-23 DIAGNOSIS — I10 Essential (primary) hypertension: Secondary | ICD-10-CM | POA: Diagnosis not present

## 2023-05-23 DIAGNOSIS — Z79899 Other long term (current) drug therapy: Secondary | ICD-10-CM | POA: Diagnosis not present

## 2023-05-23 NOTE — Patient Instructions (Addendum)
Medication Instructions:  Your physician recommends that you continue on your current medications as directed. Please refer to the Current Medication list given to you today.  *If you need a refill on your cardiac medications before your next appointment, please call your pharmacy*   Lab Work: Your physician recommends that you have labs drawn today" CMET, CBC, TSH, Lipid If you have labs (blood work) drawn today and your tests are completely normal, you will receive your results only by: MyChart Message (if you have MyChart) OR A paper copy in the mail If you have any lab test that is abnormal or we need to change your treatment, we will call you to review the results.   Testing/Procedures: Christena Deem- Long Term Monitor Instructions  Your physician has requested you wear a ZIO patch monitor for 7 days.  This is a single patch monitor. [employer] supplies one patch monitor per enrollment. Additional stickers are not available. Please do not apply patch if you will be having a Nuclear Stress Test,  Echocardiogram, Cardiac CT, MRI, or Chest Xray during the period you would be wearing the  monitor. The patch cannot be worn during these tests. You cannot remove and re-apply the  ZIO XT patch monitor.  Your ZIO patch monitor will be mailed 3 day USPS to your address on file. It may take 3-5 days  to receive your monitor after you have been enrolled.  Once you have received your monitor, please review the enclosed instructions. Your monitor  has already been registered assigning a specific monitor serial # to you.  Billing and Patient Assistance Program Information  We have supplied [employer] with any of your insurance information on file for billing purposes. [employer] offers a sliding scale Patient Assistance Program for patients that do not have  insurance, or whose insurance does not completely cover the cost of the ZIO monitor.  You must apply for the Patient Assistance Program to qualify for  this discounted rate.  To apply, please call [employer] at [phone], select option 4, select option 2, ask to apply for  Patient Assistance Program. Meredeth Ide will ask your household income, and how many people  are in your household. They will quote your out-of-pocket cost based on that information.  [employer] will also be able to set up a 36-month, interest-free payment plan if needed.  Applying the monitor   Shave hair from upper left chest.  Hold abrader disc by orange tab. Rub abrader in 40 strokes over the upper left chest as  indicated in your monitor instructions.  Clean area with 4 enclosed alcohol pads. Let dry.  Apply patch as indicated in monitor instructions. Patch will be placed under collarbone on left  side of chest with arrow pointing upward.  Rub patch adhesive wings for 2 minutes. Remove white label marked "1". Remove the white  label marked "2". Rub patch adhesive wings for 2 additional minutes.  While looking in a mirror, press and release button in center of patch. A small green light will  flash 3-4 times. This will be your only indicator that the monitor has been turned on.  Do not shower for the first 24 hours. You may shower after the first 24 hours.  Press the button if you feel a symptom. You will hear a small click. Record Date, Time and  Symptom in the Patient Logbook.  When you are ready to remove the patch, follow instructions on the last 2 pages of Patient  Logbook. Stick patch monitor  onto the last page of Patient Logbook.  Place Patient Logbook in the blue and white box. Use locking tab on box and tape box closed  securely. The blue and white box has prepaid postage on it. Please place it in the mailbox as  soon as possible. Your physician should have your test results approximately 7 days after the  monitor has been mailed back to [facility]  Call [facility] Customer Care at [phone] if you have questions regarding  your ZIO XT patch monitor.  Call them immediately if you see an orange light blinking on your  monitor.  If your monitor falls off in less than 4 days, contact our Monitor department at [phone].  If your monitor becomes loose or falls off after 4 days call [employer] at [phone] for  suggestions on securing your monitor    Follow-Up: At [facility], you and your health needs are our priority.  As part of our continuing mission to provide you with exceptional heart care, we have created designated Provider Care Teams.  These Care Teams include your primary Cardiologist (physician) and Advanced Practice Providers (APPs -  Physician Assistants and Nurse Practitioners) who all work together to provide you with the care you need, when you need it.   Your next appointment:   4 months  Provider:   [name], MD

## 2023-05-23 NOTE — Progress Notes (Unsigned)
Enrolled patient for a 7 day Zio XT monitor to be mailed to patients home.  

## 2023-05-24 LAB — COMPREHENSIVE METABOLIC PANEL
ALT: 12 IU/L (ref 0–44)
AST: 15 IU/L (ref 0–40)
Albumin: 3.9 g/dL (ref 3.8–4.8)
Alkaline Phosphatase: 76 IU/L (ref 44–121)
BUN/Creatinine Ratio: 17 (ref 10–24)
BUN: 16 mg/dL (ref 8–27)
Bilirubin Total: 0.2 mg/dL (ref 0.0–1.2)
CO2: 27 mmol/L (ref 20–29)
Calcium: 9.3 mg/dL (ref 8.6–10.2)
Chloride: 106 mmol/L (ref 96–106)
Creatinine, Ser: 0.95 mg/dL (ref 0.76–1.27)
Globulin, Total: 2 g/dL (ref 1.5–4.5)
Glucose: 73 mg/dL (ref 70–99)
Potassium: 4.7 mmol/L (ref 3.5–5.2)
Sodium: 143 mmol/L (ref 134–144)
Total Protein: 5.9 g/dL — ABNORMAL LOW (ref 6.0–8.5)
eGFR: 81 mL/min/{1.73_m2} (ref >59)

## 2023-05-24 LAB — TSH+T4F+T3FREE
Free T4: 1.01 ng/dL (ref 0.82–1.77)
T3, Free: 2.6 pg/mL (ref 2.0–4.4)
TSH: 2.38 u[IU]/mL (ref 0.450–4.500)

## 2023-05-24 LAB — LIPID PANEL
Chol/HDL Ratio: 2.8 ratio (ref 0.0–5.0)
Cholesterol, Total: 169 mg/dL (ref 100–199)
HDL: 61 mg/dL (ref >39)
LDL Chol Calc (NIH): 97 mg/dL (ref 0–99)
Triglycerides: 55 mg/dL (ref 0–149)
VLDL Cholesterol Cal: 11 mg/dL (ref 5–40)

## 2023-05-24 LAB — CBC WITH DIFFERENTIAL/PLATELET
Basophils Absolute: 0.1 10*3/uL (ref 0.0–0.2)
Basos: 1 %
EOS (ABSOLUTE): 0.2 10*3/uL (ref 0.0–0.4)
Eos: 2 %
Hematocrit: 44.4 % (ref 37.5–51.0)
Hemoglobin: 15.1 g/dL (ref 13.0–17.7)
Immature Grans (Abs): 0 10*3/uL (ref 0.0–0.1)
Immature Granulocytes: 0 %
Lymphocytes Absolute: 8.6 10*3/uL — ABNORMAL HIGH (ref 0.7–3.1)
Lymphs: 66 %
MCH: 29.7 pg (ref 26.6–33.0)
MCHC: 34 g/dL (ref 31.5–35.7)
MCV: 87 fL (ref 79–97)
Monocytes Absolute: 0.7 10*3/uL (ref 0.1–0.9)
Monocytes: 5 %
Neutrophils Absolute: 3.3 10*3/uL (ref 1.4–7.0)
Neutrophils: 26 %
Platelets: 254 10*3/uL (ref 150–450)
RBC: 5.09 x10E6/uL (ref 4.14–5.80)
RDW: 12.2 % (ref 11.6–15.4)
WBC: 12.8 10*3/uL — ABNORMAL HIGH (ref 3.4–10.8)

## 2023-05-26 ENCOUNTER — Encounter: Payer: Self-pay | Admitting: *Deleted

## 2023-05-26 ENCOUNTER — Telehealth: Payer: Self-pay | Admitting: Cardiology

## 2023-05-26 NOTE — Telephone Encounter (Signed)
Spoke with pt daughter, aware the zio monitor is to let us know if he needs a pacemaker or not. The appointment with dr Graciela Husbands will determine if pacing is needed. Explained there is a little restriction in the movement of the arm on the side of the pacer if he were to get one. Aware all that will be discussed once it has been decided that he needs the procedure.

## 2023-05-26 NOTE — Telephone Encounter (Signed)
Patient's daughter is requesting call back to get information on potential pacemaker implant and f/u care. Please advise.

## 2023-05-27 DIAGNOSIS — R001 Bradycardia, unspecified: Secondary | ICD-10-CM

## 2023-05-30 ENCOUNTER — Other Ambulatory Visit: Payer: Self-pay

## 2023-05-30 DIAGNOSIS — C9110 Chronic lymphocytic leukemia of B-cell type not having achieved remission: Secondary | ICD-10-CM

## 2023-05-31 ENCOUNTER — Other Ambulatory Visit: Payer: Self-pay

## 2023-05-31 ENCOUNTER — Inpatient Hospital Stay: Payer: Medicare Other | Admitting: Hematology

## 2023-05-31 ENCOUNTER — Inpatient Hospital Stay: Payer: Medicare Other | Attending: Hematology

## 2023-05-31 VITALS — BP 130/58 | HR 57 | Temp 97.9°F | Resp 18 | Wt 158.4 lb

## 2023-05-31 DIAGNOSIS — C9110 Chronic lymphocytic leukemia of B-cell type not having achieved remission: Secondary | ICD-10-CM

## 2023-05-31 LAB — CBC WITH DIFFERENTIAL (CANCER CENTER ONLY)
Abs Immature Granulocytes: 0.03 10*3/uL (ref 0.00–0.07)
Basophils Absolute: 0.1 10*3/uL (ref 0.0–0.1)
Basophils Relative: 0 %
Eosinophils Absolute: 0.2 10*3/uL (ref 0.0–0.5)
Eosinophils Relative: 2 %
HCT: 41.3 % (ref 39.0–52.0)
Hemoglobin: 13.7 g/dL (ref 13.0–17.0)
Immature Granulocytes: 0 %
Lymphocytes Relative: 62 %
Lymphs Abs: 7.7 10*3/uL — ABNORMAL HIGH (ref 0.7–4.0)
MCH: 30 pg (ref 26.0–34.0)
MCHC: 33.2 g/dL (ref 30.0–36.0)
MCV: 90.4 fL (ref 80.0–100.0)
Monocytes Absolute: 0.7 10*3/uL (ref 0.1–1.0)
Monocytes Relative: 5 %
Neutro Abs: 4 10*3/uL (ref 1.7–7.7)
Neutrophils Relative %: 31 %
Platelet Count: 234 10*3/uL (ref 150–400)
RBC: 4.57 MIL/uL (ref 4.22–5.81)
RDW: 13.6 % (ref 11.5–15.5)
Smear Review: NORMAL
WBC Count: 12.7 10*3/uL — ABNORMAL HIGH (ref 4.0–10.5)
nRBC: 0 % (ref 0.0–0.2)

## 2023-05-31 LAB — CMP (CANCER CENTER ONLY)
ALT: 9 U/L (ref 0–44)
AST: 13 U/L — ABNORMAL LOW (ref 15–41)
Albumin: 3.5 g/dL (ref 3.5–5.0)
Alkaline Phosphatase: 75 U/L (ref 38–126)
Anion gap: 4 — ABNORMAL LOW (ref 5–15)
BUN: 21 mg/dL (ref 8–23)
CO2: 28 mmol/L (ref 22–32)
Calcium: 9 mg/dL (ref 8.9–10.3)
Chloride: 110 mmol/L (ref 98–111)
Creatinine: 0.87 mg/dL (ref 0.61–1.24)
GFR, Estimated: >60 mL/min (ref >60)
Glucose, Bld: 94 mg/dL (ref 70–99)
Potassium: 3.9 mmol/L (ref 3.5–5.1)
Sodium: 142 mmol/L (ref 135–145)
Total Bilirubin: 0.3 mg/dL (ref 0.3–1.2)
Total Protein: 6.2 g/dL — ABNORMAL LOW (ref 6.5–8.1)

## 2023-05-31 LAB — LACTATE DEHYDROGENASE: LDH: 142 U/L (ref 98–192)

## 2023-05-31 NOTE — Progress Notes (Signed)
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 male who is here for follow-up of CLL. Patient was last seen by me on 11/30/2022 and was doing well overall since his last visit.   Today, he is accompanied by his daughter.He reports that 3 weeks ago, patient endorsed vision issues with dizziness after awakening. He did not yet smoke from his tobacco pipe that day. Patient notes that he spent time outdoors in the heat the day before.   Patient was seen by Cardiology and was given a Zio patch to wear for 4 days. He will follow with cardiology soon to discuss results. He denies endorsing any dizzy episodes while wearing the Zio patch.   Patient regularly monitors his HR and BP at home and notes that his HR typically ranges 53-61 bpm and her BP generally ranges 123-134 systolic. Patient has a history of low HR in the 50s with no associated symptoms. His HR in clinic today is 57 bpm with blood pressure of 130/58.  He reports that he regularly stays hydrated. Patient notes that he has reduced his caffeine intake from 12 cups of coffee to 2-3 cups daily.   He denies any fever, chills, night sweats, back pain, or abdominal pain.   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, had seen urology and done all the studies, no known cause   Cancer (HCC)    Prostate cancer status post radiation seeds   Elevated prostate specific  antigen (PSA)    has had seed implants and also seen by [facility]   Neuromuscular disorder [facility])     SURGICAL HISTORY: Past Surgical History:  Procedure Laterality Date   APPENDECTOMY     CHOLECYSTECTOMY     INSERTION PROSTATE RADIATION SEED     SHOULDER SURGERY      SOCIAL HISTORY: Social History   Socioeconomic History   Marital status: Divorced    Spouse name: Not on file   Number of children: Not on file   Years of education: Not on file   Highest education level: Not on file  Occupational History   Not on file  Tobacco Use   Smoking status: Every Day    Types: Pipe   Smokeless tobacco: Never  Substance and Sexual Activity   Alcohol use: No   Drug use: No   Sexual activity: Not on file  Other Topics Concern   Not on file  Social History Narrative   Not on file   Social Determinants of Health   Financial Resource Strain: Not on file  Food Insecurity: Not on file  Transportation Needs: Not on file  Physical Activity: Not on file  Stress: Not on file  Social Connections: Not on file  Intimate Partner Violence: Not on file    FAMILY HISTORY: Family History  Problem Relation Age of Onset   Heart attack Mother     ALLERGIES:  has No Known Allergies.  MEDICATIONS:  Current Outpatient Medications  Medication Sig Dispense Refill   amLODipine (NORVASC) 10 MG tablet Take 1 tablet (10 mg total) by mouth daily. 90 tablet 1   atorvastatin (LIPITOR) 20 MG tablet Take 1 tablet (20 mg total) by mouth daily. 90 tablet 3   No current facility-administered medications for this visit.    REVIEW OF SYSTEMS:    10 Point review of Systems was done is negative except as noted above.   PHYSICAL EXAMINATION: ECOG PERFORMANCE STATUS: 0 - Asymptomatic  . Vitals:   05/31/23 1254  BP: (!) 130/58  Pulse: (!) 57  Resp: 18  Temp: 97.9 F (36.6 C)  SpO2: 95%   Filed Weights   05/31/23 1254  Weight: 158 lb 6.4 oz (71.8 kg)   Body mass index is 22.73  kg/m.    GENERAL:alert, in no acute distress and comfortable SKIN: no acute rashes, no significant lesions EYES: conjunctiva are pink and non-injected, sclera anicteric OROPHARYNX: MMM, no exudates, no oropharyngeal erythema or ulceration NECK: supple, no JVD LYMPH:  no palpable lymphadenopathy in the cervical, axillary or inguinal regions LUNGS: clear to auscultation b/l with normal respiratory effort HEART: regular rate & rhythm ABDOMEN:  normoactive bowel sounds , non tender, not distended. Extremity: no pedal edema PSYCH: alert & oriented x 3 with fluent speech NEURO: no focal motor/sensory deficits   LABORATORY DATA:  I have reviewed the data as listed  .    Latest Ref Rng & Units 05/31/2023   12:18 PM 05/23/2023   12:05 PM 11/30/2022    2:16 PM  CBC  WBC 4.0 - 10.5 K/uL 12.7  12.8  13.0   Hemoglobin 13.0 - 17.0 g/dL 13.0  86.5  78.4   Hematocrit 39.0 - 52.0 % 41.3  44.4  45.0   Platelets 150 - 400 K/uL 234  254  233     .    Latest Ref Rng & Units 05/23/2023   12:05 PM 11/30/2022    2:16 PM 05/02/2022    9:03 AM  CMP  Glucose 70 - 99 mg/dL 73  98  696   BUN 8 - 27 mg/dL 16  16  20    Creatinine 0.76 - 1.27 mg/dL 2.95  2.84  1.32   Sodium 134 - 144 mmol/L 143  138  139   Potassium 3.5 - 5.2 mmol/L 4.7  4.6  4.2   Chloride 96 - 106 mmol/L 106  102  106   CO2 20 - 29 mmol/L 27  30  29    Calcium 8.6 - 10.2 mg/dL 9.3  9.2  9.3   Total Protein 6.0 - 8.5 g/dL 5.9  7.3  6.4   Total Bilirubin 0.0 - 1.2 mg/dL 0.2  0.3  0.3   Alkaline Phos 44 - 121 IU/L 76  72  66   AST 0 - 40 IU/L 15  17  15    ALT 0 - 44 IU/L 12  12  10     . 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    RADIOGRAPHIC STUDIES: I have personally reviewed  the radiological images as listed and agreed with the findings in the report. No results found.  ASSESSMENT & PLAN:   82 y.o. 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 CBC stable, showed WBC of 12.7K, hemoglobin of 13.7, and platelets of 234K. -Lymphocyte unchanged at 7.7K -CMP stable, kidney function and liver enzymes stable -educated patient that individuals with 13q deletion typically may not require treatment until 5-10 years  -Patient has no new symptoms suggestive of symptomatic CLL progression.  No significant palpable lymphadenopathy or hepatosplenomegaly. -No indication for treatment of the patient's CLL at this time. -patient's episode of vision issues with dizziness may be related to heat fatigue if there is no dizzy spells associated with low heart rates -informed patient that too much coffee may cause abnormal heart rhythms -educated patient that Amlodipine may cause dizziness. Discussed option of cutting back on dose of Amlodipine if blood pressure is too low.  -advised patient to regularly check BP at home in the mornings to determine baseline -continue to stay regularly active -Patient is in the process of obtaining a PCP with [facility]. He will be seen by Dr. Betti Cruz for the first time soon. -dicussed option to receive lab work with PCP in 6 months -RTC in 1 year with labs -continue to follow with cardiology regularly for optimal management -recommend annual follow-up visits with dermatology as 13q deletion  CLL may cause skin cancer  FOLLOW-UP: RTC with Dr Candise Che with labs in 12 months  The total time spent in the appointment was 20 minutes* .  All of the patient's questions were answered with apparent satisfaction. The patient knows to call the clinic with any problems, questions or concerns.   Wyvonnia Lora MD MS AAHIVMS [facility] Hematology/Oncology Physician [facility]  .*Total Encounter Time as defined by the Centers for Medicare and Medicaid Services includes, in addition to the face-to-face time of a patient visit (documented in the note above) non-face-to-face time: obtaining and reviewing outside history, ordering and reviewing medications, tests or procedures, care coordination (communications with other health care professionals or caregivers) and documentation in the medical record.    I,Mitra Faeizi,acting as a Neurosurgeon for Wyvonnia Lora, MD.,have documented all relevant documentation on the behalf of Wyvonnia Lora, MD,as directed by  Wyvonnia Lora, MD while in the presence of Wyvonnia Lora, MD.  .I have reviewed the above documentation for accuracy and completeness, and I agree with the above. Johney Maine MD

## 2023-06-01 DIAGNOSIS — Z136 Encounter for screening for cardiovascular disorders: Secondary | ICD-10-CM | POA: Diagnosis not present

## 2023-06-01 DIAGNOSIS — Z0000 Encounter for general adult medical examination without abnormal findings: Secondary | ICD-10-CM | POA: Diagnosis not present

## 2023-06-01 DIAGNOSIS — Z125 Encounter for screening for malignant neoplasm of prostate: Secondary | ICD-10-CM | POA: Diagnosis not present

## 2023-06-08 ENCOUNTER — Other Ambulatory Visit: Payer: Self-pay | Admitting: Physician Assistant

## 2023-06-08 DIAGNOSIS — R001 Bradycardia, unspecified: Secondary | ICD-10-CM | POA: Diagnosis not present

## 2023-06-08 DIAGNOSIS — M8588 Other specified disorders of bone density and structure, other site: Secondary | ICD-10-CM

## 2023-06-12 DIAGNOSIS — D510 Vitamin B12 deficiency anemia due to intrinsic factor deficiency: Secondary | ICD-10-CM | POA: Diagnosis not present

## 2023-06-19 DIAGNOSIS — D510 Vitamin B12 deficiency anemia due to intrinsic factor deficiency: Secondary | ICD-10-CM | POA: Diagnosis not present

## 2023-06-23 ENCOUNTER — Ambulatory Visit: Payer: Medicare Other | Admitting: Internal Medicine

## 2023-06-26 DIAGNOSIS — D510 Vitamin B12 deficiency anemia due to intrinsic factor deficiency: Secondary | ICD-10-CM | POA: Diagnosis not present

## 2023-07-03 DIAGNOSIS — E538 Deficiency of other specified B group vitamins: Secondary | ICD-10-CM | POA: Diagnosis not present

## 2023-08-07 DIAGNOSIS — E538 Deficiency of other specified B group vitamins: Secondary | ICD-10-CM | POA: Diagnosis not present

## 2023-08-11 ENCOUNTER — Telehealth: Payer: Self-pay

## 2023-08-11 NOTE — Telephone Encounter (Signed)
Spoke to patent chest ct to follow up lung nodules scheduled 11/6 arrive at 9:45 am at [facility].

## 2023-08-21 ENCOUNTER — Ambulatory Visit: Payer: Medicare Other | Attending: Internal Medicine | Admitting: Internal Medicine

## 2023-08-21 ENCOUNTER — Encounter: Payer: Self-pay | Admitting: Internal Medicine

## 2023-08-21 VITALS — BP 140/82 | HR 48 | Ht 70.0 in | Wt 158.6 lb

## 2023-08-21 DIAGNOSIS — I471 Supraventricular tachycardia, unspecified: Secondary | ICD-10-CM

## 2023-08-21 DIAGNOSIS — R001 Bradycardia, unspecified: Secondary | ICD-10-CM | POA: Diagnosis not present

## 2023-08-21 NOTE — Patient Instructions (Signed)
Medication Instructions:  Your physician recommends that you continue on your current medications as directed. Please refer to the Current Medication list given to you today.  *If you need a refill on your cardiac medications before your next appointment, please call your pharmacy*   Lab Work: None ordered.  If you have labs (blood work) drawn today and your tests are completely normal, you will receive your results only by: MyChart Message (if you have MyChart) OR A paper copy in the mail If you have any lab test that is abnormal or we need to change your treatment, we will call you to review the results.   Testing/Procedures: None ordered.    Follow-Up: At [facility], you and your health needs are our priority.  As part of our continuing mission to provide you with exceptional heart care, we have created designated Provider Care Teams.  These Care Teams include your primary Cardiologist (physician) and Advanced Practice Providers (APPs -  Physician Assistants and Nurse Practitioners) who all work together to provide you with the care you need, when you need it.  We recommend signing up for the patient portal called "MyChart".  Sign up information is provided on this After Visit Summary.  MyChart is used to connect with patients for Virtual Visits (Telemedicine).  Patients are able to view lab/test results, encounter notes, upcoming appointments, etc.  Non-urgent messages can be sent to your provider as well.   To learn more about what you can do with MyChart, go to [email or web address].    Your next appointment:   Follow up with Dr Klein as needed 

## 2023-08-21 NOTE — Progress Notes (Signed)
ELECTROPHYSIOLOGY CONSULT NOTE  Patient ID: Dominic Butler, MRN: [id number], DOB/AGE: 23-Jul-1941 82 y.o. Admit date: (Not on file) Date of Consult: 08/21/2023  Primary Physician: Sherren Mocha, MD Primary Cardiologist: CSch     Dominic Butler is a 82 y.o. male who is being seen today for the evaluation of bradycardia  at the request of Dr CS.    HPI Dominic Butler is a 82 y.o. male referred for consideration of pacing because of slow heart rates.  He brings in his blood pressure reports showing blood pressures in the 1 teens-130 range but heart rates in the high 40s to mid 50s.  Event recorder  7/24 without significant arrhythmia average heart rate was 65 minimum heart rate was 45 Daytime average heart rates are in the 50s to 80s.  No complaints of exercise limitations, shortness of breath chest pain; no syncope or presyncope.  Occasional with very limited orthostatic lightheadedness.  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)       Surgical History:  Past Surgical History:  Procedure Laterality Date   APPENDECTOMY     CHOLECYSTECTOMY     INSERTION PROSTATE RADIATION SEED     SHOULDER SURGERY       Home Meds: Current Meds  Medication Sig   amLODipine (NORVASC) 10 MG tablet Take 1 tablet (10 mg total) by mouth daily.   atorvastatin (LIPITOR) 20 MG tablet Take 1 tablet (20 mg total) by mouth daily.    Allergies: No Known Allergies  Social History   Socioeconomic History    Marital status: Divorced    Spouse name: Not on file   Number of children: Not on file   Years of education: Not on file   Highest education level: Not on file  Occupational History   Not on file  Tobacco Use   Smoking status: Every Day    Types: Pipe   Smokeless tobacco: Never  Substance and Sexual Activity   Alcohol use: No   Drug use: No   Sexual activity: Not on file  Other Topics Concern   Not on file  Social History Narrative   Not on file   Social Determinants of Health   Financial Resource Strain: Not on file  Food Insecurity: No Food Insecurity (10/21/2021)   Received from [facility], [facility]   Hunger Vital Sign    Worried About Running Out of Food in the Last Year: Never true    Ran Out of Food in the Last Year: Never true  Transportation Needs: Not on file  Physical Activity: Not on file  Stress: Not on file  Social Connections: Unknown (03/20/2022)   Received from [facility], [facility]   Social Network    Social  Network: Not on file  Intimate Partner Violence: Unknown (02/25/2022)   Received from [facility], [facility]   HITS    Physically Hurt: Not on file    Insult or Talk Down To: Not on file    Threaten Physical Harm: Not on file    Scream or Curse: Not on file     Family History  Problem Relation Age of Onset   Heart attack Mother      ROS:  Please see the history of present illness.    All other systems reviewed and negative.    Physical Exam: Blood pressure (!) 140/82, pulse (!) 48, height 5\' 10"  (1.778 m), weight 158 lb 9.6 oz (71.9 kg), SpO2 96%. General: Well developed, well nourished male in no acute distress. Head: Normocephalic, atraumatic, sclera non-icteric, no xanthomas, nares are without discharge. EENT: normal  Lymph Nodes:  none Neck: Negative for carotid bruits. JVD not elevated. Back:without scoliosis kyphosis Lungs: Clear bilaterally to auscultation without wheezes, rales, or rhonchi. Breathing is  unlabored. Heart: RRR with S1 S2. No  /6 systolic murmur . No rubs, or gallops appreciated. Abdomen: Soft, non-tender, non-distended with normoactive bowel sounds. No hepatomegaly. No rebound/guarding. No obvious abdominal masses. Msk:  Strength and tone appear normal for age. Extremities: No clubbing or cyanosis. No + edema.  Distal pedal pulses are 2+ and equal bilaterally. Skin: Warm and Dry Neuro: Alert and oriented X 3. CN III-XII intact Grossly normal sensory and motor function . Psych:  Responds to questions appropriately with a normal affect.        EKG: Sinus at 48 Interval 17/14/46 Right bundle branch block (Incomplete right bundle branch block was noted 2001)  Assessment and Plan:   Sinus bradycardia  Right bundle branch block  Coronary atherosclerosis  Elevated blood pressure  The patient has no symptoms that I can ascribe to his modest sinus bradycardia.  Hence, would not proceed with pacing at this point.  We have outlined important symptoms of which to be aware, lightheadedness presyncope or syncope as well as exercise intolerance that could potentially be manifesting his sinus node dysfunction.  Will be glad to see him again as needed. Sherryl Manges

## 2023-09-04 DIAGNOSIS — L309 Dermatitis, unspecified: Secondary | ICD-10-CM | POA: Diagnosis not present

## 2023-09-04 DIAGNOSIS — D229 Melanocytic nevi, unspecified: Secondary | ICD-10-CM | POA: Diagnosis not present

## 2023-09-04 DIAGNOSIS — L578 Other skin changes due to chronic exposure to nonionizing radiation: Secondary | ICD-10-CM | POA: Diagnosis not present

## 2023-09-04 DIAGNOSIS — D1801 Hemangioma of skin and subcutaneous tissue: Secondary | ICD-10-CM | POA: Diagnosis not present

## 2023-09-04 DIAGNOSIS — L570 Actinic keratosis: Secondary | ICD-10-CM | POA: Diagnosis not present

## 2023-09-04 DIAGNOSIS — L821 Other seborrheic keratosis: Secondary | ICD-10-CM | POA: Diagnosis not present

## 2023-09-04 DIAGNOSIS — L814 Other melanin hyperpigmentation: Secondary | ICD-10-CM | POA: Diagnosis not present

## 2023-09-06 DIAGNOSIS — E538 Deficiency of other specified B group vitamins: Secondary | ICD-10-CM | POA: Diagnosis not present

## 2023-09-27 ENCOUNTER — Ambulatory Visit (HOSPITAL_BASED_OUTPATIENT_CLINIC_OR_DEPARTMENT_OTHER)
Admission: RE | Admit: 2023-09-27 | Discharge: 2023-09-27 | Disposition: A | Discharge status: Home / Self Care | Payer: Medicare Other | Source: Ambulatory Visit | Attending: Cardiology | Admitting: Cardiology

## 2023-09-27 DIAGNOSIS — R911 Solitary pulmonary nodule: Secondary | ICD-10-CM | POA: Diagnosis not present

## 2023-09-27 DIAGNOSIS — R918 Other nonspecific abnormal finding of lung field: Secondary | ICD-10-CM | POA: Diagnosis not present

## 2023-09-27 DIAGNOSIS — C61 Malignant neoplasm of prostate: Secondary | ICD-10-CM | POA: Diagnosis not present

## 2023-09-27 DIAGNOSIS — J439 Emphysema, unspecified: Secondary | ICD-10-CM | POA: Diagnosis not present

## 2023-10-01 NOTE — Progress Notes (Unsigned)
Cardiology Office Note:    Date:  10/03/2023   ID:  Dominic Butler, Dominic Butler 24-Sep-1941, MRN [id number]  PCP:  Carin Hock, PA  Cardiologist:  [name], MD  Electrophysiologist:  None   Referring MD: Sherren Mocha, MD   Chief Complaint  Patient presents with   Bradycardia     History of Present Illness:    Dominic Butler is a 82 y.o. male with a hx of aortic stenosis, prostate cancer, CLL, hypertension who presents for follow-up.  He was referred by Dr. Clelia Croft for evaluation of abnormal EKG and heart murmur, initially seen on 07/24/2020.  He denies any chest pain or dyspnea.  Reports he does yard work every day and he denies any exertional symptoms.  Denies any lightheadedness or syncope recently.  Does report he had an episode of lightheadedness while driving 3 years ago, but none since.  He denies any palpitations or lower extremity edema.  Smokes pipe every day.  Mother died of MI at age 35.  Echocardiogram on 08/11/2020 showed normal biventricular function, moderate LVH, grade 2 diastolic dysfunction, mild aortic stenosis.  Zio patch x7 days showed 12 episodes of SVT, longest lasting 13 seconds.  Calcium score 459 (55th percentile) on 01/04/2023.  Also noted on calcium score to have right lower lobe pulmonary nodule measuring 8 mm.  Echocardiogram 12/2022 showed EF 60 to 65%, mild LVH, normal RV function, moderate left atrial enlargement, mild right atrial enlargement, mild aortic stenosis.  Zio patch x 6 days 05/2023 showed 12 episodes of SVT with longest lasting 14 beats.  Since last clinic visit, he reports he is doing well.  Has had no further lightheadedness.  Denies any syncope.  Denies any chest pain, dyspnea, lower extremity edema, or palpitations.  Continues to smoke a pipe, not interested in quitting.   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)     Past Surgical History:  Procedure Laterality Date   APPENDECTOMY     CHOLECYSTECTOMY     INSERTION PROSTATE RADIATION SEED     SHOULDER SURGERY      Current Medications: Current Meds  Medication Sig   amLODipine (NORVASC) 10 MG tablet Take 1 tablet (10 mg total) by mouth daily.   atorvastatin (LIPITOR) 20 MG tablet Take 1 tablet (20 mg total) by mouth daily.     Allergies:   Patient has no known allergies.   Social History   Socioeconomic History   Marital status: Divorced    Spouse name: Not on file   Number of children: Not on file   Years of education: Not on file   Highest education level: Not on file  Occupational History   Not on file  Tobacco Use   Smoking status: Every Day    Types: Pipe   Smokeless tobacco: Never  Substance and Sexual Activity   Alcohol use: No   Drug use: No   Sexual activity: Not on file  Other Topics Concern  Not on file  Social History Narrative   Not on file   Social Determinants of Health   Financial Resource Strain: Not on file  Food Insecurity: No Food Insecurity (10/21/2021)   Received from [facility], [facility]   Hunger Vital Sign    Worried About Running Out of Food in the Last Year: Never true    Ran Out of Food in the Last Year: Never true  Transportation Needs: Not on file  Physical Activity: Not on file  Stress: Not on file  Social Connections: Unknown (03/20/2022)   Received from [facility], [facility]   Social Network    Social Network: Not on file     Family History: The patient's family history includes Heart attack in his mother.  ROS:   Please see the history of present illness.      All other systems reviewed and are negative.  EKGs/Labs/Other Studies Reviewed:    The following studies were reviewed today:   EKG:   10/12/21: Sinus bradycardia, rate 56, right bundle branch block, left anterior fascicular block 11/25/2022: Normal sinus rhythm, right bundle branch block, left axis deviation, rate 60 05/23/23: SB, rate 52, RBBB, LAFB 10/03/2023: Sinus bradycardia, rate 48, right bundle branch block, left anterior fascicular block  Recent Labs: 05/23/2023: TSH 2.380 05/31/2023: ALT 9; BUN 21; Creatinine 0.87; Hemoglobin 13.7; Platelet Count 234; Potassium 3.9; Sodium 142  Recent Lipid Panel    Component Value Date/Time   CHOL 169 05/23/2023 1205   TRIG 55 05/23/2023 1205   HDL 61 05/23/2023 1205   CHOLHDL 2.8 05/23/2023 1205   CHOLHDL 1.9 02/20/2015 1021   VLDL 9 02/20/2015 1021   LDLCALC 97 05/23/2023 1205    Physical Exam:    VS:  BP 122/60 (BP Location: Left Arm, Patient Position: Sitting, Cuff Size: Normal)   Pulse (!) 48   Ht 5\' 10"  (1.778 m)   Wt 164 lb (74.4 kg)   SpO2 94%   BMI 23.53 kg/m     Wt Readings from Last 3 Encounters:  10/03/23 164 lb (74.4 kg)  08/21/23 158 lb 9.6 oz (71.9 kg)  05/31/23 158 lb 6.4 oz (71.8 kg)     GEN: Well nourished, well developed in no acute distress HEENT: Normal NECK: No JVD; No carotid bruits CARDIAC: braydcardic, regular, 3 out of 6 systolic murmur loudest at RUSB RESPIRATORY:  Clear to auscultation without rales, wheezing or rhonchi  ABDOMEN: Soft, non-tender, non-distended MUSCULOSKELETAL:  No edema; No deformity  SKIN: Warm and dry NEUROLOGIC:  Alert and oriented x 3 PSYCHIATRIC:  Normal affect   ASSESSMENT:    1. Bradycardia   2. Aortic valve stenosis, etiology of cardiac valve disease unspecified   3. SVT (supraventricular tachycardia) (HCC)   4. Essential hypertension   5. Hyperlipidemia, unspecified hyperlipidemia type   6. Lung nodule      PLAN:    Bradycardia: Had episode of  lightheadedness and went to urgent care,, EKG shows sinus bradycardia with rate 46.  EKG shows bifascicular block.  Zio patch x 6 days 05/2023 showed 12 episodes of SVT with longest lasting 14 beats.  Seen by Dr. Graciela Husbands, no indication for PPM at this time  Aortic stenosis: Mild on echocardiogram 12/2022.  Will monitor  SVT: Zio patch x7 days on 08/13/2020 showed 12 episodes of SVT, longest lasting 13 seconds.  No symptoms reported during episodes.  Hypertension: On amlodipine 10 mg daily.    Tobacco use: continues to smoke pipe, not interested  in quitting.  Counseled on the risks of tobacco use and cessation strongly recommended  Hyperlipidemia: LDL 90 on 11/26/2020, 10-year ASCVD risk score 30%.  Recommended starting statin, he initially declined wanting to try lifestyle changes.  Repeat lipid panel 07/2021 showed LDL 88.  Started atorvastatin 20 mg daily.  LDL 75 on 10/12/2021. -He reports he stopped taking the statin because was concerned about possible side effects.  Calcium score 459 (55th percentile) on 01/04/2023.  Restarted atorvastatin 20 mg daily.  LDL 74 on 06/01/2023  Pulmonary nodule: right lower lobe pulmonary nodule measuring 8 mm on CT 12/2022.  Repeat chest CT 09/2023 is pending  CLL: Follows with oncology, no indication for treatment at this time  RTC in 6 months   Medication Adjustments/Labs and Tests Ordered: Current medicines are reviewed at length with the patient today.  Concerns regarding medicines are outlined above.  Orders Placed This Encounter  Procedures   EKG 12-Lead    No orders of the defined types were placed in this encounter.    Patient Instructions  Medication Instructions:  Continue current medications *If you need a refill on your cardiac medications before your next appointment, please call your pharmacy*   Lab Work: none If you have labs (blood work) drawn today and your tests are completely normal, you will receive your results only by: MyChart  Message (if you have MyChart) OR A paper copy in the mail If you have any lab test that is abnormal or we need to change your treatment, we will call you to review the results.   Testing/Procedures: none   Follow-Up: At [facility], you and your health needs are our priority.  As part of our continuing mission to provide you with exceptional heart care, we have created designated Provider Care Teams.  These Care Teams include your primary Cardiologist (physician) and Advanced Practice Providers (APPs -  Physician Assistants and Nurse Practitioners) who all work together to provide you with the care you need, when you need it.  Your next appointment:   6 month(s)  Provider:   [name], MD        Signed, [name], MD  10/03/2023 9:30 AM    [facility]

## 2023-10-03 ENCOUNTER — Encounter: Payer: Self-pay | Admitting: Cardiology

## 2023-10-03 ENCOUNTER — Ambulatory Visit: Payer: Medicare Other | Attending: Cardiology | Admitting: Cardiology

## 2023-10-03 VITALS — BP 122/60 | HR 48 | Ht 70.0 in | Wt 164.0 lb

## 2023-10-03 DIAGNOSIS — I471 Supraventricular tachycardia, unspecified: Secondary | ICD-10-CM | POA: Diagnosis not present

## 2023-10-03 DIAGNOSIS — R911 Solitary pulmonary nodule: Secondary | ICD-10-CM | POA: Diagnosis not present

## 2023-10-03 DIAGNOSIS — I350 Nonrheumatic aortic (valve) stenosis: Secondary | ICD-10-CM | POA: Diagnosis not present

## 2023-10-03 DIAGNOSIS — I10 Essential (primary) hypertension: Secondary | ICD-10-CM | POA: Diagnosis not present

## 2023-10-03 DIAGNOSIS — E785 Hyperlipidemia, unspecified: Secondary | ICD-10-CM | POA: Diagnosis not present

## 2023-10-03 DIAGNOSIS — R001 Bradycardia, unspecified: Secondary | ICD-10-CM | POA: Diagnosis not present

## 2023-10-03 NOTE — Patient Instructions (Signed)
 Medication Instructions:  Continue current medications *If you need a refill on your cardiac medications before your next appointment, please call your pharmacy*   Lab Work: none If you have labs (blood work) drawn today and your tests are completely normal, you will receive your results only by: MyChart Message (if you have MyChart) OR A paper copy in the mail If you have any lab test that is abnormal or we need to change your treatment, we will call you to review the results.   Testing/Procedures: none   Follow-Up: At [facility], you and your health needs are our priority.  As part of our continuing mission to provide you with exceptional heart care, we have created designated Provider Care Teams.  These Care Teams include your primary Cardiologist (physician) and Advanced Practice Providers (APPs -  Physician Assistants and Nurse Practitioners) who all work together to provide you with the care you need, when you need it.     Your next appointment:   6 month(s)  Provider:   [name], MD

## 2023-10-06 DIAGNOSIS — E538 Deficiency of other specified B group vitamins: Secondary | ICD-10-CM | POA: Diagnosis not present

## 2023-10-06 DIAGNOSIS — R319 Hematuria, unspecified: Secondary | ICD-10-CM | POA: Diagnosis not present

## 2023-10-18 ENCOUNTER — Other Ambulatory Visit (HOSPITAL_BASED_OUTPATIENT_CLINIC_OR_DEPARTMENT_OTHER): Payer: Self-pay

## 2023-10-18 DIAGNOSIS — R911 Solitary pulmonary nodule: Secondary | ICD-10-CM

## 2023-10-25 ENCOUNTER — Other Ambulatory Visit: Payer: Self-pay

## 2023-10-25 DIAGNOSIS — R911 Solitary pulmonary nodule: Secondary | ICD-10-CM

## 2023-11-03 DIAGNOSIS — I10 Essential (primary) hypertension: Secondary | ICD-10-CM | POA: Diagnosis not present

## 2023-11-03 DIAGNOSIS — D510 Vitamin B12 deficiency anemia due to intrinsic factor deficiency: Secondary | ICD-10-CM | POA: Diagnosis not present

## 2023-11-03 DIAGNOSIS — E785 Hyperlipidemia, unspecified: Secondary | ICD-10-CM | POA: Diagnosis not present

## 2023-11-03 DIAGNOSIS — Z6824 Body mass index (BMI) 24.0-24.9, adult: Secondary | ICD-10-CM | POA: Diagnosis not present

## 2023-11-20 DIAGNOSIS — Z8546 Personal history of malignant neoplasm of prostate: Secondary | ICD-10-CM | POA: Diagnosis not present

## 2023-11-20 DIAGNOSIS — R310 Gross hematuria: Secondary | ICD-10-CM | POA: Diagnosis not present

## 2023-11-30 DIAGNOSIS — I10 Essential (primary) hypertension: Secondary | ICD-10-CM | POA: Diagnosis not present

## 2023-11-30 DIAGNOSIS — E785 Hyperlipidemia, unspecified: Secondary | ICD-10-CM | POA: Diagnosis not present

## 2023-12-04 DIAGNOSIS — I10 Essential (primary) hypertension: Secondary | ICD-10-CM | POA: Diagnosis not present

## 2023-12-04 DIAGNOSIS — E785 Hyperlipidemia, unspecified: Secondary | ICD-10-CM | POA: Diagnosis not present

## 2023-12-04 DIAGNOSIS — C9110 Chronic lymphocytic leukemia of B-cell type not having achieved remission: Secondary | ICD-10-CM | POA: Diagnosis not present

## 2023-12-04 DIAGNOSIS — D510 Vitamin B12 deficiency anemia due to intrinsic factor deficiency: Secondary | ICD-10-CM | POA: Diagnosis not present

## 2023-12-04 DIAGNOSIS — Z6824 Body mass index (BMI) 24.0-24.9, adult: Secondary | ICD-10-CM | POA: Diagnosis not present

## 2023-12-06 DIAGNOSIS — C61 Malignant neoplasm of prostate: Secondary | ICD-10-CM | POA: Diagnosis not present

## 2023-12-06 DIAGNOSIS — R310 Gross hematuria: Secondary | ICD-10-CM | POA: Diagnosis not present

## 2023-12-06 DIAGNOSIS — K5730 Diverticulosis of large intestine without perforation or abscess without bleeding: Secondary | ICD-10-CM | POA: Diagnosis not present

## 2023-12-06 DIAGNOSIS — R319 Hematuria, unspecified: Secondary | ICD-10-CM | POA: Diagnosis not present

## 2023-12-26 DIAGNOSIS — M25571 Pain in right ankle and joints of right foot: Secondary | ICD-10-CM | POA: Diagnosis not present

## 2023-12-26 DIAGNOSIS — R609 Edema, unspecified: Secondary | ICD-10-CM | POA: Diagnosis not present

## 2023-12-27 ENCOUNTER — Other Ambulatory Visit: Payer: Self-pay | Admitting: Urology

## 2023-12-27 DIAGNOSIS — R310 Gross hematuria: Secondary | ICD-10-CM | POA: Diagnosis not present

## 2023-12-27 DIAGNOSIS — D414 Neoplasm of uncertain behavior of bladder: Secondary | ICD-10-CM | POA: Diagnosis not present

## 2023-12-29 ENCOUNTER — Encounter (HOSPITAL_COMMUNITY): Payer: Self-pay

## 2023-12-29 NOTE — Progress Notes (Addendum)
COVID Vaccine Completed:  Yes  Date of COVID positive in last 90 days:  No  PCP - Carin Hock, PA Cardiologist - Epifanio Lesches, MD  Chest x-ray - CT chest 09-27-23 Epic EKG - 10-03-23 Epic Stress Test - Yes, 10+ years ago ECHO - 01-04-23 Epic Cardiac Cath -  [name] Pacemaker/ICD device last checked: Spinal Cord Stimulator:  [name] Long Term Monitor - 06-08-23 Epic Cardiac CT - 01-04-23 Epic  Bowel Prep - [name]  Sleep Study -  [name] CPAP -   Fasting Blood Sugar -  [name] Checks Blood Sugar _____ times a day  Last dose of GLP1 agonist-  [name] GLP1 instructions:  Hold 7 days before surgery    Last dose of SGLT-2 inhibitors-  [name] SGLT-2 instructions:  Hold 3 days before surgery   Blood Thinner Instructions:  [name] Aspirin Instructions: Last Dose:  Activity level:  Can go up a flight of stairs and perform activities of daily living without stopping and without symptoms of chest pain or shortness of breath.   Able to exercise without symptoms  Anesthesia review:  Mild aortic stenosis. Bradycardia, SVT, HTN  Patient denies shortness of breath, fever, cough and chest pain at PAT appointment  Patient verbalized understanding of instructions that were given to them at the PAT appointment. Patient was also instructed that they will need to review over the PAT instructions again at home before surgery.

## 2023-12-29 NOTE — Patient Instructions (Addendum)
SURGICAL WAITING ROOM VISITATION Patients having surgery or a procedure may have no more than 2 support people in the waiting area - these visitors may rotate.    Children under the age of 54 must have an adult with them who is not the patient.  Due to an increase in RSV and influenza rates and associated hospitalizations, children ages 46 and under may not visit patients in [facility] hospitals.   If the patient needs to stay at the hospital during part of their recovery, the visitor guidelines for inpatient rooms apply. Pre-op nurse will coordinate an appropriate time for 1 support person to accompany patient in pre-op.  This support person may not rotate.    Please refer to the [facility] website for the visitor guidelines for Inpatients (after your surgery is over and you are in a regular room).       Your procedure is scheduled on: 01-09-24   Report to [facility] Main Entrance    Report to admitting at 2:15 PM   Call this number if you have problems the morning of surgery [phone]   Do not eat food or drink liquids :After Midnight.           If you have questions, please contact your surgeon's office.   FOLLOW BOWEL PREP AND ANY ADDITIONAL PRE OP INSTRUCTIONS YOU RECEIVED FROM YOUR SURGEON'S OFFICE!!!     Oral Hygiene is also important to reduce your risk of infection.                                    Remember - BRUSH YOUR TEETH THE MORNING OF SURGERY WITH YOUR REGULAR TOOTHPASTE   Do NOT smoke after Midnight   Take these medicines the morning of surgery with A SIP OF WATER:    Amlodipine   Atorvastatin  Stop all vitamins and herbal supplements 7 days before surgery                              You may not have any metal on your body including  jewelry, and body piercing             Do not wear  lotions, powders, cologne, or deodorant              Men may shave face and neck.   Do not bring valuables to the hospital.  IS NOT RESPONSIBLE    FOR VALUABLES.   Contacts, dentures or bridgework may not be worn into surgery.  DO NOT BRING YOUR HOME MEDICATIONS TO THE HOSPITAL. PHARMACY WILL DISPENSE MEDICATIONS LISTED ON YOUR MEDICATION LIST TO YOU DURING YOUR ADMISSION IN THE HOSPITAL!    Patients discharged on the day of surgery will not be allowed to drive home.  Someone NEEDS to stay with you for the first 24 hours after anesthesia.               Please read over the following fact sheets you were given: IF YOU HAVE QUESTIONS ABOUT YOUR PRE-OP INSTRUCTIONS PLEASE CALL [phone] [place]  If you received a COVID test during your pre-op visit  it is requested that you wear a mask when out in public, stay away from anyone that may not be feeling well and notify your surgeon if you develop symptoms. If you test positive for Covid or have been in  contact with anyone that has tested positive in the last 10 days please notify you surgeon.  [place] - Preparing for Surgery Before surgery, you can play an important role.  Because skin is not sterile, your skin needs to be as free of germs as possible.  You can reduce the number of germs on your skin by washing with CHG (chlorahexidine gluconate) soap before surgery.  CHG is an antiseptic cleaner which kills germs and bonds with the skin to continue killing germs even after washing. Please DO NOT use if you have an allergy to CHG or antibacterial soaps.  If your skin becomes reddened/irritated stop using the CHG and inform your nurse when you arrive at Short Stay. Do not shave (including legs and underarms) for at least 48 hours prior to the first CHG shower.  You may shave your face/neck.  Please follow these instructions carefully:  1.  Shower with CHG Soap the night before surgery and the  morning of surgery.  2.  If you choose to wash your hair, wash your hair first as usual with your normal  shampoo.  3.  After you shampoo, rinse your hair and body thoroughly to remove the shampoo.                              4.  Use CHG as you would any other liquid soap.  You can apply chg directly to the skin and wash.  Gently with a scrungie or clean washcloth.  5.  Apply the CHG Soap to your body ONLY FROM THE NECK DOWN.   Do   not use on face/ open                           Wound or open sores. Avoid contact with eyes, ears mouth and   genitals (private parts).                       Wash face,  Genitals (private parts) with your normal soap.             6.  Wash thoroughly, paying special attention to the area where your    surgery  will be performed.  7.  Thoroughly rinse your body with warm water from the neck down.  8.  DO NOT shower/wash with your normal soap after using and rinsing off the CHG Soap.                9.  Pat yourself dry with a clean towel.            10.  Wear clean pajamas.            11.  Place clean sheets on your bed the night of your first shower and do not  sleep with pets. Day of Surgery : Do not apply any lotions/deodorants the morning of surgery.  Please wear clean clothes to the hospital/surgery center.  FAILURE TO FOLLOW THESE INSTRUCTIONS MAY RESULT IN THE CANCELLATION OF YOUR SURGERY  PATIENT SIGNATURE_________________________________  NURSE SIGNATURE__________________________________  ________________________________________________________________________

## 2024-01-02 ENCOUNTER — Other Ambulatory Visit: Payer: Self-pay

## 2024-01-02 ENCOUNTER — Encounter (HOSPITAL_COMMUNITY): Payer: Self-pay

## 2024-01-02 ENCOUNTER — Encounter (HOSPITAL_COMMUNITY)
Admission: RE | Admit: 2024-01-02 | Discharge: 2024-01-02 | Disposition: A | Discharge status: Home / Self Care | Payer: Medicare Other | Source: Ambulatory Visit | Attending: Urology | Admitting: Urology

## 2024-01-02 VITALS — BP 154/75 | HR 52 | Temp 97.8°F | Resp 16 | Ht 69.0 in | Wt 160.4 lb

## 2024-01-02 DIAGNOSIS — Z01812 Encounter for preprocedural laboratory examination: Secondary | ICD-10-CM | POA: Insufficient documentation

## 2024-01-02 DIAGNOSIS — Z01818 Encounter for other preprocedural examination: Secondary | ICD-10-CM

## 2024-01-02 DIAGNOSIS — F17210 Nicotine dependence, cigarettes, uncomplicated: Secondary | ICD-10-CM | POA: Diagnosis not present

## 2024-01-02 DIAGNOSIS — I350 Nonrheumatic aortic (valve) stenosis: Secondary | ICD-10-CM | POA: Diagnosis not present

## 2024-01-02 DIAGNOSIS — I10 Essential (primary) hypertension: Secondary | ICD-10-CM | POA: Insufficient documentation

## 2024-01-02 DIAGNOSIS — I2510 Atherosclerotic heart disease of native coronary artery without angina pectoris: Secondary | ICD-10-CM | POA: Insufficient documentation

## 2024-01-02 DIAGNOSIS — D494 Neoplasm of unspecified behavior of bladder: Secondary | ICD-10-CM | POA: Diagnosis not present

## 2024-01-02 HISTORY — DX: Bradycardia, unspecified: R00.1

## 2024-01-02 HISTORY — DX: Personal history of urinary calculi: Z87.442

## 2024-01-02 HISTORY — DX: Nonrheumatic aortic (valve) stenosis: I35.0

## 2024-01-02 HISTORY — DX: Essential (primary) hypertension: I10

## 2024-01-02 LAB — BASIC METABOLIC PANEL
Anion gap: 10 (ref 5–15)
BUN: 14 mg/dL (ref 8–23)
CO2: 27 mmol/L (ref 22–32)
Calcium: 9.1 mg/dL (ref 8.9–10.3)
Chloride: 105 mmol/L (ref 98–111)
Creatinine, Ser: 0.92 mg/dL (ref 0.61–1.24)
GFR, Estimated: >60 mL/min (ref >60)
Glucose, Bld: 97 mg/dL (ref 70–99)
Potassium: 4.1 mmol/L (ref 3.5–5.1)
Sodium: 142 mmol/L (ref 135–145)

## 2024-01-02 LAB — CBC
HCT: 45.3 % (ref 39.0–52.0)
Hemoglobin: 14.4 g/dL (ref 13.0–17.0)
MCH: 29.3 pg (ref 26.0–34.0)
MCHC: 31.8 g/dL (ref 30.0–36.0)
MCV: 92.1 fL (ref 80.0–100.0)
Platelets: 267 10*3/uL (ref 150–400)
RBC: 4.92 MIL/uL (ref 4.22–5.81)
RDW: 13.2 % (ref 11.5–15.5)
WBC: 11.2 10*3/uL — ABNORMAL HIGH (ref 4.0–10.5)
nRBC: 0 % (ref 0.0–0.2)

## 2024-01-03 NOTE — Progress Notes (Signed)
Anesthesia Chart Review   Case: [id number] Date/Time: 01/09/24 1615   Procedure: TRANSURETHRAL RESECTION OF BLADDER TUMOR (TURBT) - 30 MINUTES NEEDED   Anesthesia type: General   Pre-op diagnosis: BLADDER TUMOR   Location: [facility] ROOM 08 / [facility]   Surgeons: Noel Christmas, MD       DISCUSSION:82 y.o. smoker with h/o HTN, mild aortic stenosis on Echo 12/2022, CLL follows with hem/onc, bladder tumor scheduled for above procedure 01/09/2024 with Dr. Kasandra Knudsen.   Pt last seen by cardiology 10/03/2023. Stable at this visit with 6 month follow up recommended.  VS: BP (!) 154/75   Pulse (!) 52   Temp 36.6 C (Oral)   Resp 16   Ht 5\' 9"  (1.753 m)   Wt 72.8 kg   SpO2 94%   BMI 23.69 kg/m   PROVIDERS: Carin Hock, PA is PCP   Cardiologist - Epifanio Lesches, MD   LABS: Labs reviewed: Acceptable for surgery. (all labs ordered are listed, but only abnormal results are displayed)  Labs Reviewed  CBC - Abnormal; Notable for the following components:      Result Value   WBC 11.2 (*)    All other components within normal limits  BASIC METABOLIC PANEL     IMAGES:   EKG:   CV: Echo 01/04/2023 1. Left ventricular ejection fraction, by estimation, is 60 to 65%. The  left ventricle has normal function. The left ventricle has no regional  wall motion abnormalities. There is mild concentric left ventricular  hypertrophy. Left ventricular diastolic  parameters are indeterminate.   2. Right ventricular systolic function is normal. The right ventricular  size is normal. There is normal pulmonary artery systolic pressure.   3. Left atrial size was moderately dilated.   4. Right atrial size was mildly dilated.   5. The mitral valve is grossly normal. No evidence of mitral valve  regurgitation. No evidence of mitral stenosis.   6. The aortic valve is calcified. There is moderate calcification of the  aortic valve. There is moderate thickening of the aortic valve. Aortic   valve regurgitation is trivial. Mild aortic valve stenosis. Aortic valve  area, by VTI measures 1.87 cm.  Aortic valve mean gradient measures 17.7 mmHg. Aortic valve Vmax measures  2.87 m/s.   7. The inferior vena cava is normal in size with greater than 50%  respiratory variability, suggesting right atrial pressure of 3 mmHg.  Past Medical History:  Diagnosis Date   Allergy    Benign hematuria    chronic issue for last 30 years, had seen urology and done all the studies, no known cause   Bradycardia    Cancer (HCC)    Prostate cancer status post radiation seeds   Elevated prostate specific antigen (PSA)    has had seed implants and also seen by [facility]   History of kidney stones    Hypertension    Mild aortic stenosis    Neuromuscular disorder (HCC)     Past Surgical History:  Procedure Laterality Date   APPENDECTOMY     CHOLECYSTECTOMY     INSERTION PROSTATE RADIATION SEED     PROSTATE BIOPSY     SHOULDER SURGERY      MEDICATIONS:  amLODipine (NORVASC) 10 MG tablet   atorvastatin (LIPITOR) 20 MG tablet   No current facility-administered medications for this encounter.    Jodell Cipro Ward, PA-C [facility] Pre-Surgical Testing [phone]

## 2024-01-08 NOTE — H&P (Signed)
CC/HPI: cc: Gross hematuria   11/20/2023: 83 year old man with a longstanding history of microscopic hematuria and a negative evaluation about 25 years ago also with a history of prostate cancer treated with brachytherapy in 2009 comes in with several days of dark urine followed by 1 episode of painless gross hematuria that occurred in November 2024. Patient denies kidney stones or flank pain. He smoked a pipe for about 60 years. 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 He is not very bothered by his urinary symptoms. PSA <1 in July 2024.   IPSS: 0, 2, 0, 4, 0, 0, 1= 7/35  1/6 pleased quality-of-life   12/27/23: Here for cysto as part of gh evaluation. CT showed a bladder mass.     ALLERGIES: No Allergies    MEDICATIONS: Amlodipine Besylate 10 mg tablet     GU PSH: No GU PSH      PSH Notes: Laparoscopy Repair Of Initial Inguinal Hernia, Cholecystectomy Laparoscopic, Shoulder Surgery, Appendectomy   NON-GU PSH: Appendectomy - 2008 Cholecystectomy (laparoscopic) - 2009 Colonoscopy Inguinal hernia repair (laparoscopic) - 2009 Shoulder Arthroscopy/surgery         GU PMH: Gross hematuria - 12/06/2023, - 11/20/2023 History of prostate cancer - 12/06/2023, - 11/20/2023 Elevated PSA, Elevated prostate specific antigen (PSA) - 2014      PMH Notes: Osteoarthritis  H/O prostate CA with previous brachytherapy (seeds). NO oncology f/u for years.   NON-GU PMH: Asthma, Asthma - 2014 Cardiac murmur, unspecified Encounter for general adult medical examination without abnormal findings, Encounter for preventive health examination Hypercholesterolemia Hypertension    FAMILY HISTORY: Breast Cancer - Runs In Family Chronic Liver Disease - Father Cirrhosis - Mother liver cancer - Runs In Family Lung Cancer - Father   SOCIAL HISTORY: Marital Status: Divorced Preferred Language: English; Ethnicity: Not Hispanic Or Latino; Race: White Current Smoking Status: Patient smokes.  <DIV'  Tobacco Use  Assessment Completed:  Used Tobacco in last 30 days?   Does not drink anymore.  Drinks 4+ caffeinated drinks per day.     Notes: Alcohol Use, Occupation:, Marital History - Divorced, Tobacco Use   REVIEW OF SYSTEMS:     GU Review Male:  Patient denies frequent urination, hard to postpone urination, burning/ pain with urination, get up at night to urinate, leakage of urine, stream starts and stops, trouble starting your stream, have to strain to urinate , erection problems, and penile pain.    Gastrointestinal (Upper):  Patient denies nausea, vomiting, and indigestion/ heartburn.    Gastrointestinal (Lower):  Patient denies diarrhea and constipation.    Constitutional:  Patient denies fever, night sweats, weight loss, and fatigue.    Skin:  Patient denies skin rash/ lesion and itching.    Eyes:  Patient denies blurred vision and double vision.    Ears/ Nose/ Throat:  Patient denies sore throat and sinus problems.    Hematologic/Lymphatic:  Patient denies swollen glands and easy bruising.    Cardiovascular:  Patient denies leg swelling and chest pains.    Respiratory:  Patient denies cough and shortness of breath.    Endocrine:  Patient denies excessive thirst.    Musculoskeletal:  Patient denies back pain and joint pain.    Neurological:  Patient denies headaches and dizziness.    Psychologic:  Patient denies depression and anxiety.    VITAL SIGNS: None     MULTI-SYSTEM PHYSICAL EXAMINATION:      Constitutional: Well-nourished. No physical deformities. Normally developed. Good grooming.     Neck:  Neck symmetrical, not swollen. Normal tracheal position.     Respiratory: No labored breathing, no use of accessory muscles.      Skin: No paleness, no jaundice, no cyanosis. No lesion, no ulcer, no rash.     Neurologic / Psychiatric: Oriented to time, oriented to place, oriented to person. No depression, no anxiety, no agitation.     Eyes: Normal conjunctivae. Normal eyelids.     Ears, Nose,  Mouth, and Throat: Left ear no scars, no lesions, no masses. Right ear no scars, no lesions, no masses. Nose no scars, no lesions, no masses. Normal hearing. Normal lips.     Musculoskeletal: Normal gait and station of head and neck.            Complexity of Data:   Records Review:  Previous Patient Records, POC Tool  Urine Test Review:  Urinalysis  X-Ray Review: C.T. Abdomen/Pelvis: Reviewed Films. Reviewed Report. Discussed With Patient. IMPRESSION:  1. Enhancing endoluminal mass within the lateral aspect of the  bladder measuring 1.0 x 0.6 cm, consistent with bladder malignancy.  2. No evidence of lymphadenopathy or metastatic disease in the  abdomen or pelvis.  3. Prostate brachytherapy.  4. Descending and sigmoid diverticulosis without evidence of acute  diverticulitis.  5. Coronary artery disease.   These results will be called to the ordering clinician or  representative by the Radiologist Assistant, and communication  documented in the PACS or [employer].   Aortic Atherosclerosis (ICD10-I70.0).    Electronically Signed  By: Jearld Lesch M.D.  On: 12/15/2023 10:55     07/02/08 03/31/08 03/28/07 09/26/06 03/28/06  PSA  Total PSA 6.72  5.71  4.55  4.70  3.91   Free PSA 0.93  0.64  0.79  0.92    % Free PSA 13.8  11.2  17.4  19.6      PROCEDURES:    Flexible Cystoscopy - 52000  Risks, benefits, and some of the potential complications of the procedure were discussed at length with the patient including infection, bleeding, voiding discomfort, urinary retention, fever, chills, sepsis, and others. All questions were answered. Informed consent was obtained. Sterile technique and intraurethral analgesia were used.  Meatus:  Normal size. Normal location. Normal condition.  Urethra:  No strictures.  External Sphincter:  Normal.  Verumontanum:  Normal.  Prostate:  Non-obstructing. No hyperplasia.  Bladder Neck:  Non-obstructing.  Ureteral Orifices:  Normal location.  Normal size. Normal shape. Effluxed clear urine.  Bladder:  Moderate trabeculation. 2cm papillary bladder tumor with calcification on left lateral bladder wall. Irregular mucosa extending another 2 cm and area of irregular mucosa posterior bladder wall approx 2 cm x 1 cm      The lower urinary tract was carefully examined. The procedure was well-tolerated and without complications. Antibiotic instructions were given. Instructions were given to call the office immediately for bloody urine, difficulty urinating, urinary retention, painful or frequent urination, fever, chills, nausea, vomiting or other illness. The patient stated that he understood these instructions and would comply with them.    Urinalysis w/Scope  Dipstick Dipstick Cont'd Micro  Color: Yellow Bilirubin: Neg mg/dL WBC/hpf: 0 - 5/hpf  Appearance: Slightly Cloudy Ketones: Neg mg/dL RBC/hpf: 20 - 16/XWR  Specific Gravity: 1.020 Blood: 3+ ery/uL Bacteria: Rare (0-9/hpf)  pH: 5.5 Protein: Neg mg/dL Cystals: NS (Not Seen)  Glucose: Neg mg/dL Urobilinogen: 0.2 mg/dL Casts: NS (Not Seen)   Nitrites: Neg Trichomonas: Not Present   Leukocyte Esterase: Neg leu/uL Mucous: Not Present  Epithelial Cells: 0 - 5/hpf    Yeast: NS (Not Seen)    Sperm: Not Present    ASSESSMENT:     ICD-10 Details  1 GU:  Gross hematuria - R31.0 Undiagnosed New Problem  2  Bladder tumor/neoplasm - D41.4 Undiagnosed New Problem   PLAN:   Document  Letter(s):  Created for Patient: Clinical Summary   Notes:  Bladder tumor:  -Cystoscopy confirmed papillary bladder mass as well as other areas of irregular mucosa concerning for bladder cancer  -This was discussed with both the patient and his daughter  -We discussed neck steps will be a transurethral resection of bladder tumor and intravesical gemcitabine. Risks and benefits were discussed in detail including but not limited to pain, bleeding, infection, bladder perforation, need for indwelling Foley  catheter, need for additional treatment, damage to surrounding structures including ureter and bladder, urgency/frequency.   Schedule next available transurethral resection of bladder tumor with intravesical gemcitabine

## 2024-01-09 ENCOUNTER — Ambulatory Visit (HOSPITAL_COMMUNITY)
Admission: RE | Admit: 2024-01-09 | Discharge: 2024-01-09 | Disposition: A | Discharge status: Home / Self Care | Payer: Medicare Other | Attending: Urology | Admitting: Urology

## 2024-01-09 ENCOUNTER — Ambulatory Visit (HOSPITAL_COMMUNITY): Payer: Medicare Other | Admitting: Physician Assistant

## 2024-01-09 ENCOUNTER — Encounter (HOSPITAL_COMMUNITY): Payer: Self-pay | Admitting: Urology

## 2024-01-09 ENCOUNTER — Encounter (HOSPITAL_COMMUNITY)
Admission: RE | Disposition: A | Discharge status: Home / Self Care | Payer: Self-pay | Source: Home / Self Care | Attending: Urology

## 2024-01-09 ENCOUNTER — Ambulatory Visit (HOSPITAL_BASED_OUTPATIENT_CLINIC_OR_DEPARTMENT_OTHER): Payer: Medicare Other | Admitting: Anesthesiology

## 2024-01-09 DIAGNOSIS — C679 Malignant neoplasm of bladder, unspecified: Secondary | ICD-10-CM

## 2024-01-09 DIAGNOSIS — C9110 Chronic lymphocytic leukemia of B-cell type not having achieved remission: Secondary | ICD-10-CM | POA: Insufficient documentation

## 2024-01-09 DIAGNOSIS — C678 Malignant neoplasm of overlapping sites of bladder: Secondary | ICD-10-CM | POA: Insufficient documentation

## 2024-01-09 DIAGNOSIS — D494 Neoplasm of unspecified behavior of bladder: Secondary | ICD-10-CM | POA: Diagnosis not present

## 2024-01-09 DIAGNOSIS — I10 Essential (primary) hypertension: Secondary | ICD-10-CM

## 2024-01-09 DIAGNOSIS — Z79899 Other long term (current) drug therapy: Secondary | ICD-10-CM | POA: Diagnosis not present

## 2024-01-09 DIAGNOSIS — F17210 Nicotine dependence, cigarettes, uncomplicated: Secondary | ICD-10-CM | POA: Diagnosis not present

## 2024-01-09 DIAGNOSIS — F17290 Nicotine dependence, other tobacco product, uncomplicated: Secondary | ICD-10-CM | POA: Insufficient documentation

## 2024-01-09 HISTORY — PX: TRANSURETHRAL RESECTION OF BLADDER TUMOR: SHX2575

## 2024-01-09 SURGERY — TURBT (TRANSURETHRAL RESECTION OF BLADDER TUMOR)
Anesthesia: General | Site: Bladder

## 2024-01-09 MED ORDER — ACETAMINOPHEN 500 MG PO TABS: 1000.0000 mg | ORAL_TABLET | Freq: Once | ORAL | Status: AC

## 2024-01-09 MED ORDER — PROPOFOL 10 MG/ML IV BOLUS
INTRAVENOUS | Status: AC
  Filled 2024-01-09: qty 20

## 2024-01-09 MED ORDER — LIDOCAINE HCL (CARDIAC) PF 100 MG/5ML IV SOSY
PREFILLED_SYRINGE | INTRAVENOUS | Status: DC | PRN
  Administered 2024-01-09: 40 mg via INTRAVENOUS

## 2024-01-09 MED ORDER — TRAMADOL HCL 50 MG PO TABS: 50.0000 mg | ORAL_TABLET | Freq: Four times a day (QID) | ORAL | 0 refills | Status: AC | PRN

## 2024-01-09 MED ORDER — FENTANYL CITRATE PF 50 MCG/ML IJ SOSY: 25.0000 ug | PREFILLED_SYRINGE | INTRAMUSCULAR | Status: DC | PRN

## 2024-01-09 MED ORDER — PROPOFOL 10 MG/ML IV BOLUS
INTRAVENOUS | Status: DC | PRN
  Administered 2024-01-09: 100 mg via INTRAVENOUS

## 2024-01-09 MED ORDER — ROCURONIUM BROMIDE 100 MG/10ML IV SOLN
INTRAVENOUS | Status: DC | PRN
  Administered 2024-01-09: 50 mg via INTRAVENOUS

## 2024-01-09 MED ORDER — CHLORHEXIDINE GLUCONATE 0.12 % MT SOLN
15.0000 mL | Freq: Once | OROMUCOSAL | Status: AC
  Administered 2024-01-09: 15 mL via OROMUCOSAL

## 2024-01-09 MED ORDER — FENTANYL CITRATE (PF) 100 MCG/2ML IJ SOLN
INTRAMUSCULAR | Status: DC | PRN
  Administered 2024-01-09: 100 ug via INTRAVENOUS

## 2024-01-09 MED ORDER — LIDOCAINE HCL (PF) 2 % IJ SOLN
INTRAMUSCULAR | Status: AC
  Filled 2024-01-09: qty 5

## 2024-01-09 MED ORDER — FENTANYL CITRATE (PF) 100 MCG/2ML IJ SOLN
INTRAMUSCULAR | Status: AC
  Filled 2024-01-09: qty 2

## 2024-01-09 MED ORDER — MEPERIDINE HCL 50 MG/ML IJ SOLN: 6.2500 mg | INTRAMUSCULAR | Status: DC | PRN

## 2024-01-09 MED ORDER — LACTATED RINGERS IV SOLN: INTRAVENOUS | Status: DC | PRN

## 2024-01-09 MED ORDER — ONDANSETRON HCL 4 MG/2ML IJ SOLN
INTRAMUSCULAR | Status: AC
  Filled 2024-01-09: qty 2

## 2024-01-09 MED ORDER — SUGAMMADEX SODIUM 200 MG/2ML IV SOLN
INTRAVENOUS | Status: DC | PRN
  Administered 2024-01-09: 200 mg via INTRAVENOUS

## 2024-01-09 MED ORDER — MIDAZOLAM HCL 2 MG/2ML IJ SOLN: 0.5000 mg | Freq: Once | INTRAMUSCULAR | Status: DC | PRN

## 2024-01-09 MED ORDER — ORAL CARE MOUTH RINSE: 15.0000 mL | Freq: Once | OROMUCOSAL | Status: AC

## 2024-01-09 MED ORDER — DEXAMETHASONE SODIUM PHOSPHATE 10 MG/ML IJ SOLN
INTRAMUSCULAR | Status: DC | PRN
  Administered 2024-01-09: 8 mg via INTRAVENOUS

## 2024-01-09 MED ORDER — DEXAMETHASONE SODIUM PHOSPHATE 10 MG/ML IJ SOLN
INTRAMUSCULAR | Status: AC
Start: 2024-01-09 — End: ?
  Filled 2024-01-09: qty 1

## 2024-01-09 MED ORDER — ACETAMINOPHEN 500 MG PO TABS
ORAL_TABLET | ORAL | Status: AC
  Administered 2024-01-09: 1000 mg via ORAL
  Filled 2024-01-09: qty 2

## 2024-01-09 MED ORDER — ROCURONIUM BROMIDE 10 MG/ML (PF) SYRINGE
PREFILLED_SYRINGE | INTRAVENOUS | Status: AC
  Filled 2024-01-09: qty 10

## 2024-01-09 MED ORDER — ONDANSETRON HCL 4 MG/2ML IJ SOLN
INTRAMUSCULAR | Status: DC | PRN
  Administered 2024-01-09: 4 mg via INTRAVENOUS

## 2024-01-09 MED ORDER — SODIUM CHLORIDE 0.9 % IR SOLN
Status: DC | PRN
  Administered 2024-01-09: 3000 mL

## 2024-01-09 MED ORDER — OXYCODONE HCL 5 MG PO TABS: 5.0000 mg | ORAL_TABLET | Freq: Once | ORAL | Status: DC | PRN

## 2024-01-09 MED ORDER — HYOSCYAMINE SULFATE 0.125 MG PO TBDP: 0.1250 mg | ORAL_TABLET | Freq: Four times a day (QID) | ORAL | 0 refills | Status: DC | PRN

## 2024-01-09 MED ORDER — 0.9 % SODIUM CHLORIDE (POUR BTL) OPTIME
TOPICAL | Status: DC | PRN
  Administered 2024-01-09: 1000 mL

## 2024-01-09 MED ORDER — CEFAZOLIN SODIUM-DEXTROSE 2-4 GM/100ML-% IV SOLN
2.0000 g | INTRAVENOUS | Status: AC
  Administered 2024-01-09: 2 g via INTRAVENOUS
  Filled 2024-01-09: qty 100

## 2024-01-09 MED ORDER — LACTATED RINGERS IV SOLN: INTRAVENOUS | Status: DC

## 2024-01-09 MED ORDER — OXYCODONE HCL 5 MG/5ML PO SOLN: 5.0000 mg | Freq: Once | ORAL | Status: DC | PRN

## 2024-01-09 SURGICAL SUPPLY — 17 items
BAG URINE DRAIN 2000ML AR STRL (UROLOGICAL SUPPLIES) IMPLANT
BAG URO CATCHER STRL LF (MISCELLANEOUS) ×1 IMPLANT
CATH TIEMANN FOLEY 18FR 5CC (CATHETERS) IMPLANT
CLOTH BEACON ORANGE TIMEOUT ST (SAFETY) ×1 IMPLANT
DRAPE FOOT SWITCH (DRAPES) ×1 IMPLANT
ELECT REM PT RETURN 15FT ADLT (MISCELLANEOUS) IMPLANT
GLOVE BIO SURGEON STRL SZ 6.5 (GLOVE) ×1 IMPLANT
GOWN STRL REUS W/ TWL LRG LVL3 (GOWN DISPOSABLE) ×1 IMPLANT
KIT TURNOVER KIT A (KITS) IMPLANT
LOOP CUT BIPOLAR 24F LRG (ELECTROSURGICAL) IMPLANT
MANIFOLD NEPTUNE II (INSTRUMENTS) ×1 IMPLANT
PACK CYSTO (CUSTOM PROCEDURE TRAY) ×1 IMPLANT
PAD PREP 24X48 CUFFED NSTRL (MISCELLANEOUS) ×1 IMPLANT
SYR TOOMEY IRRIG 70ML (MISCELLANEOUS)
SYRINGE TOOMEY IRRIG 70ML (MISCELLANEOUS) IMPLANT
TUBING CONNECTING 10 (TUBING) ×1 IMPLANT
TUBING UROLOGY SET (TUBING) ×1 IMPLANT

## 2024-01-09 NOTE — Transfer of Care (Signed)
Immediate Anesthesia Transfer of Care Note  Patient: Dominic Butler  Procedure(s) Performed: TRANSURETHRAL RESECTION OF BLADDER TUMOR (TURBT) (Bladder)  Patient Location: PACU  Anesthesia Type:General  Level of Consciousness: awake and alert   Airway & Oxygen Therapy: Patient Spontanous Breathing and Patient connected to face mask oxygen  Post-op Assessment: Report given to RN and Post -op Vital signs reviewed and stable  Post vital signs: Reviewed and stable  Last Vitals:  Vitals Value Taken Time  BP 173/79 01/09/24 1738  Temp    Pulse 55 01/09/24 1738  Resp 14 01/09/24 1738  SpO2 99 % 01/09/24 1738    Last Pain:  Vitals:   01/09/24 1501  TempSrc: Oral  PainSc:          Complications: No notable events documented.

## 2024-01-09 NOTE — Interval H&P Note (Signed)
History and Physical Interval Note:  01/09/2024 4:20 PM  Dominic Butler  has presented today for surgery, with the diagnosis of BLADDER TUMOR.  The various methods of treatment have been discussed with the patient and family. After consideration of risks, benefits and other options for treatment, the patient has consented to  Procedure(s) with comments: TRANSURETHRAL RESECTION OF BLADDER TUMOR (TURBT) ([name]) - 30 MINUTES NEEDED as a surgical intervention.  The patient's history has been reviewed, patient examined, no change in status, stable for surgery.  I have reviewed the patient's chart and labs.  Questions were answered to the patient's satisfaction.     Mikah Rottinghaus D Chizuko Trine

## 2024-01-09 NOTE — Discharge Instructions (Signed)
Post Bladder Surgery Instructions   General instructions:     Your recent bladder surgery requires very little post hospital care but some definite precautions.  Despite the fact that no skin incisions were used, the area around the bladder incisions are raw and covered with scabs to promote healing and prevent bleeding. Certain precautions are needed to insure that the scabs are not disturbed over the next 2-4 weeks while the healing proceeds.  Because the raw surface inside your bladder and the irritating effects of urine you may expect frequency of urination and/or urgency (a stronger desire to urinate) and perhaps even getting up at night more often. This will usually resolve or improve slowly over the healing period. You may see some blood in your urine over the first 6 weeks. Do not be alarmed, even if the urine was clear for a while. Get off your feet and drink lots of fluids until clearing occurs. If you start to pass clots or don't improve call us.  Catheter: (If you are discharged with a catheter.)  1. Keep your catheter secured to your leg at all times with tape or the supplied strap. 2. You may experience leakage of urine around your catheter- as long as the  catheter continues to drain, this is normal.  If your catheter stops draining  go to the ER. 3. You may also have blood in your urine, even after it has been clear for  several days; you may even pass some small blood clots or other material.  This  is normal as well.  If this happens, sit down and drink plenty of water to help  make urine to flush out your bladder.  If the blood in your urine becomes worse  after doing this, contact our office or return to the ER. 4. You may use the leg bag (small bag) during the day, but use the large bag at  night.  Diet:  You may return to your normal diet immediately. Because of the raw surface of your bladder, alcohol, spicy foods, foods high in acid and drinks with caffeine may  cause irritation or frequency and should be used in moderation. To keep your urine flowing freely and avoid constipation, drink plenty of fluids during the day (8-10 glasses). Tip: Avoid cranberry juice because it is very acidic.  Activity:  Your physical activity doesn't need to be restricted. However, if you are very active, you may see some blood in the urine. We suggest that you reduce your activity under the circumstances until the bleeding has stopped.  Bowels:  It is important to keep your bowels regular during the postoperative period. Straining with bowel movements can cause bleeding. A bowel movement every other day is reasonable. Use a mild laxative if needed, such as milk of magnesia 2-3 tablespoons, or 2 Dulcolax tablets. Call if you continue to have problems. If you had been taking narcotics for pain, before, during or after your surgery, you may be constipated. Take a laxative if necessary.    Medication:  You should resume your pre-surgery medications unless told not to. In addition you may be given an antibiotic to prevent or treat infection. Antibiotics are not always necessary. All medication should be taken as prescribed until the bottles are finished unless you are having an unusual reaction to one of the drugs.   

## 2024-01-09 NOTE — Op Note (Signed)
PATIENT:  Dominic Butler  PRE-OPERATIVE DIAGNOSIS: Bladder tumor  POST-OPERATIVE DIAGNOSIS: Same  PROCEDURE:  Procedure(s): 1. TRANSURETHRAL RESECTION OF BLADDER TUMOR (TURBT) (2cm.)   SURGEON:  Kasandra Knudsen, MD  ANESTHESIA:   General  EBL:  Minimal  DRAINS: Urethral catheter (18 Fr. Foley)   SPECIMEN:  Bladder tumor  DISPOSITION OF SPECIMEN:  PATHOLOGY  Indication:  83 yo man with painless gross hematuria found to have bladder tumor on diagnostic cystoscopy.  Description of operation: The patient was taken to the operating room and administered general anesthesia. They were then placed on the table and moved to the dorsal lithotomy position after which the genitalia was sterilely prepped and draped. An official timeout was then performed.  The 55 French resectoscope with the 30 lens and visual obturator were then passed into the bladder under direct visualization. Urethra appeared normal. The visual obturator was then removed and the Gyrus resectoscope element with 30  lens was then inserted and the bladder was fully and systematically inspected. Ureteral orifices were noted to be in the normal anatomic positions.   I first began by resecting the papillary bladder mass on the left lateral wall. Resection continued until the mass no longer seen. There were several areas of abnormal mucosa on the left lateral, posterior and superior bladder wall.  Fulguration then took place of all of these areas.   Reinspection of the bladder revealed all obvious tumor had been fully resected and there was no evidence of perforation. The toomey syringe was then used to irrigate the bladder and remove all of the portions of bladder tumor which were sent to pathology. I then removed the resectoscope. Hemostasis was adequate with the irrigant turned off.  A 18 French coude Foley catheter was then inserted in the bladder and irrigated. The irrigant returned slightly pink with no clots. The patient  was awakened and taken to the recovery room.  PLAN OF CARE: Discharge to home after PACU with foley  PATIENT DISPOSITION:  PACU - hemodynamically stable.

## 2024-01-09 NOTE — Anesthesia Procedure Notes (Signed)
Procedure Name: Intubation Date/Time: 01/09/2024 4:48 PM  Performed by: Jamelle Rushing, CRNAPre-anesthesia Checklist: Patient identified, Emergency Drugs available, Suction available, Patient being monitored and Timeout performed Patient Re-evaluated:Patient Re-evaluated prior to induction Oxygen Delivery Method: Circle system utilized Preoxygenation: Pre-oxygenation with 100% oxygen Induction Type: IV induction Ventilation: Mask ventilation without difficulty Laryngoscope Size: Mac and 3 Grade View: Grade I Tube type: Oral Tube size: 8.0 mm Number of attempts: 1 Airway Equipment and Method: Stylet Placement Confirmation: ETT inserted through vocal cords under direct vision, positive ETCO2, CO2 detector and breath sounds checked- equal and bilateral Secured at: 23 cm Tube secured with: Tape Dental Injury: Teeth and Oropharynx as per pre-operative assessment

## 2024-01-09 NOTE — Anesthesia Preprocedure Evaluation (Addendum)
Anesthesia Evaluation  Patient identified by MRN, date of birth, ID band Patient awake    Reviewed: Allergy & Precautions, NPO status , Patient's Chart, lab work & pertinent test results  History of Anesthesia Complications Negative for: history of anesthetic complications  Airway Mallampati: I  TM Distance: >3 FB Neck ROM: Full    Dental  (+) Edentulous Upper, Edentulous Lower   Pulmonary Current Smoker and Patient abstained from smoking.   breath sounds clear to auscultation       Cardiovascular hypertension, Pt. on medications (-) angina + Valvular Problems/Murmurs (mild) AS  Rhythm:Regular Rate:Normal + Systolic murmurs '24 ECHO: EF 60 to 65%.  1.The LV has normal function, no regional wall motion abnormalities. There is mild concentric LVH.   2. RVF is normal. The right ventricular size is normal. There is normal pulmonary artery systolic pressure.   3. Left atrial size was moderately dilated.   4. Right atrial size was mildly dilated.   5. The mitral valve is grossly normal. No evidence of mitral valve regurgitation. No evidence of mitral stenosis.   6. The aortic valve is calcified. There is moderate calcification of the aortic valve. There is moderate thickening of the aortic valve. Aortic valve regurgitation is trivial. Mild aortic valve stenosis. Aortic valve area, by VTI measures 1.87 cm, mean gradient measures 17.7 mmHg. Aortic valve Vmax measures 2.87 m/s.     Neuro/Psych negative neurological ROS     GI/Hepatic negative GI ROS, Neg liver ROS,,,  Endo/Other  negative endocrine ROS    Renal/GU negative Renal ROS     Musculoskeletal   Abdominal   Peds  Hematology negative hematology ROS (+)   Anesthesia Other Findings Prostate cancer  Reproductive/Obstetrics                             Anesthesia Physical Anesthesia Plan  ASA: 3  Anesthesia Plan: General   Post-op Pain  Management: Tylenol PO (pre-op)*   Induction: Intravenous  PONV Risk Score and Plan: 1 and Ondansetron and Dexamethasone  Airway Management Planned: Oral ETT  Additional Equipment: None  Intra-op Plan:   Post-operative Plan: Extubation in OR  Informed Consent: I have reviewed the patients History and Physical, chart, labs and discussed the procedure including the risks, benefits and alternatives for the proposed anesthesia with the patient or authorized representative who has indicated his/her understanding and acceptance.       Plan Discussed with: CRNA and Surgeon  Anesthesia Plan Comments:         Anesthesia Quick Evaluation

## 2024-01-10 ENCOUNTER — Encounter (HOSPITAL_COMMUNITY): Payer: Self-pay | Admitting: Urology

## 2024-01-10 NOTE — Anesthesia Postprocedure Evaluation (Signed)
Anesthesia Post Note  Patient: Dominic Butler  Procedure(s) Performed: TRANSURETHRAL RESECTION OF BLADDER TUMOR (TURBT) (Bladder)     Patient location during evaluation: PACU Anesthesia Type: General Level of consciousness: awake and alert Pain management: pain level controlled Vital Signs Assessment: post-procedure vital signs reviewed and stable Respiratory status: spontaneous breathing, nonlabored ventilation, respiratory function stable and patient connected to nasal cannula oxygen Cardiovascular status: blood pressure returned to baseline and stable Postop Assessment: no apparent nausea or vomiting Anesthetic complications: no   No notable events documented.  Last Vitals:  Vitals:   01/09/24 1830 01/09/24 1845  BP: (!) 144/69 139/61  Pulse: (!) 49 (!) 52  Resp: 11 12  Temp:    SpO2: 92% 94%    Last Pain:  Vitals:   01/09/24 1845  TempSrc:   PainSc: 0-No pain                 Collene Schlichter

## 2024-01-11 LAB — SURGICAL PATHOLOGY

## 2024-01-16 DIAGNOSIS — C678 Malignant neoplasm of overlapping sites of bladder: Secondary | ICD-10-CM | POA: Diagnosis not present

## 2024-01-23 ENCOUNTER — Other Ambulatory Visit: Payer: Self-pay | Admitting: Urology

## 2024-02-06 ENCOUNTER — Ambulatory Visit
Admission: RE | Admit: 2024-02-06 | Discharge: 2024-02-06 | Disposition: A | Discharge status: Home / Self Care | Payer: Medicare Other | Source: Ambulatory Visit | Attending: Physician Assistant | Admitting: Physician Assistant

## 2024-02-06 DIAGNOSIS — M8588 Other specified disorders of bone density and structure, other site: Secondary | ICD-10-CM | POA: Diagnosis not present

## 2024-02-09 ENCOUNTER — Other Ambulatory Visit: Payer: Self-pay | Admitting: Cardiology

## 2024-02-09 NOTE — Patient Instructions (Addendum)
 SURGICAL WAITING ROOM VISITATION  Patients having surgery or a procedure may have no more than 2 support people in the waiting area - these visitors may rotate.    Children under the age of 64 must have an adult with them who is not the patient.  Due to an increase in RSV and influenza rates and associated hospitalizations, children ages 29 and under may not visit patients in [facility] hospitals.  Visitors with respiratory illnesses are discouraged from visiting and should remain at home.  If the patient needs to stay at the hospital during part of their recovery, the visitor guidelines for inpatient rooms apply. Pre-op nurse will coordinate an appropriate time for 1 support person to accompany patient in pre-op.  This support person may not rotate.    Please refer to the [facility] website for the visitor guidelines for Inpatients (after your surgery is over and you are in a regular room).    Your procedure is scheduled on: 02/20/24   Report to [facility] Main Entrance    Report to admitting at 7:15 AM   Call this number if you have problems the morning of surgery [phone]   Do not eat food or drink liquids :After Midnight.          If you have questions, please contact your surgeon's office.   FOLLOW BOWEL PREP AND ANY ADDITIONAL PRE OP INSTRUCTIONS YOU RECEIVED FROM YOUR SURGEON'S OFFICE!!!     Oral Hygiene is also important to reduce your risk of infection.                                    Remember - BRUSH YOUR TEETH THE MORNING OF SURGERY WITH YOUR REGULAR TOOTHPASTE  DENTURES WILL BE REMOVED PRIOR TO SURGERY PLEASE DO NOT APPLY "Poly grip" OR ADHESIVES!!!   Do NOT smoke after Midnight   Stop all vitamins and herbal supplements 7 days before surgery.   Take these medicines the morning of surgery with A SIP OF WATER: Amlodipine                              You may not have any metal on your body including jewelry, and body piercing             Do not  wear lotions, powders, cologne, or deodorant              Men may shave face and neck.   Do not bring valuables to the hospital. [place] IS NOT             RESPONSIBLE   FOR VALUABLES.   Contacts, glasses, dentures or bridgework may not be worn into surgery.  DO NOT BRING YOUR HOME MEDICATIONS TO THE HOSPITAL. PHARMACY WILL DISPENSE MEDICATIONS LISTED ON YOUR MEDICATION LIST TO YOU DURING YOUR ADMISSION IN THE HOSPITAL!    Patients discharged on the day of surgery will not be allowed to drive home.  Someone NEEDS to stay with you for the first 24 hours after anesthesia.   Special Instructions: Bring a copy of your healthcare power of attorney and living will documents the day of surgery if you haven't scanned them before.              Please read over the following fact sheets you were given: IF YOU HAVE QUESTIONS ABOUT YOUR  PRE-OP INSTRUCTIONS PLEASE CALL [phone] Contras    If you received a COVID test during your pre-op visit  it is requested that you wear a mask when out in public, stay away from anyone that may not be feeling well and notify your surgeon if you develop symptoms. If you test positive for Covid or have been in contact with anyone that has tested positive in the last 10 days please notify you surgeon.    [place] - Preparing for Surgery Before surgery, you can play an important role.  Because skin is not sterile, your skin needs to be as free of germs as possible.  You can reduce the number of germs on your skin by washing with CHG (chlorahexidine gluconate) soap before surgery.  CHG is an antiseptic cleaner which kills germs and bonds with the skin to continue killing germs even after washing. Please DO NOT use if you have an allergy to CHG or antibacterial soaps.  If your skin becomes reddened/irritated stop using the CHG and inform your nurse when you arrive at Short Stay. Do not shave (including legs and underarms) for at least 48 hours prior to the first CHG  shower.  You may shave your face/neck.  Please follow these instructions carefully:  1.  Shower with CHG Soap the night before surgery and the  morning of surgery.  2.  If you choose to wash your hair, wash your hair first as usual with your normal  shampoo.  3.  After you shampoo, rinse your hair and body thoroughly to remove the shampoo.                             4.  Use CHG as you would any other liquid soap.  You can apply chg directly to the skin and wash.  Gently with a scrungie or clean washcloth.  5.  Apply the CHG Soap to your body ONLY FROM THE NECK DOWN.   Do   not use on face/ open                           Wound or open sores. Avoid contact with eyes, ears mouth and   genitals (private parts).                       Wash face,  Genitals (private parts) with your normal soap.             6.  Wash thoroughly, paying special attention to the area where your    surgery  will be performed.  7.  Thoroughly rinse your body with warm water from the neck down.  8.  DO NOT shower/wash with your normal soap after using and rinsing off the CHG Soap.                9.  Pat yourself dry with a clean towel.            10.  Wear clean pajamas.            11.  Place clean sheets on your bed the night of your first shower and do not  sleep with pets. Day of Surgery : Do not apply any lotions/deodorants the morning of surgery.  Please wear clean clothes to the hospital/surgery center.  FAILURE TO FOLLOW THESE INSTRUCTIONS MAY RESULT IN THE CANCELLATION OF YOUR SURGERY  PATIENT SIGNATURE_________________________________  NURSE SIGNATURE__________________________________  ________________________________________________________________________

## 2024-02-09 NOTE — Progress Notes (Addendum)
 COVID Vaccine Completed: yes  Date of COVID positive in last 90 days: no  PCP - Carin Hock, PA Cardiologist - Epifanio Lesches, MD Hematologist- Wyvonnia Lora, MD  CT- 09/27/23 Epic Chest x-ray - n/a EKG - 10/03/23 Epic Stress Test - n/a ECHO - 01/04/23 Epic Cardiac Cath - n/a Pacemaker/ICD device last checked: n/a Spinal Cord Stimulator:n/a  Bowel Prep - no  Sleep Study - n/a CPAP -   Fasting Blood Sugar - n/a Checks Blood Sugar _____ times a day  Last dose of GLP1 agonist-  [name] GLP1 instructions:  Hold 7 days before surgery    Last dose of SGLT-2 inhibitors-  [name] SGLT-2 instructions:  Hold 3 days before surgery    Blood Thinner Instructions:  Last dose: n/a Time: Aspirin Instructions: Last Dose:  Activity level: Can go up a flight of stairs and perform activities of daily living without stopping and without symptoms of chest pain or shortness of breath.   Anesthesia review: mild aortic stenosis, HTN, bradycardia to 46, SVT, pulmonary nodule, anemia, chronic lymphocytic leukemia  Patient denies shortness of breath, fever, cough and chest pain at PAT appointment  Patient verbalized understanding of instructions that were given to them at the PAT appointment. Patient was also instructed that they will need to review over the PAT instructions again at home before surgery.

## 2024-02-12 ENCOUNTER — Encounter (HOSPITAL_COMMUNITY)
Admission: RE | Admit: 2024-02-12 | Discharge: 2024-02-12 | Disposition: A | Discharge status: Home / Self Care | Source: Ambulatory Visit | Attending: Urology | Admitting: Urology

## 2024-02-12 ENCOUNTER — Other Ambulatory Visit: Payer: Self-pay

## 2024-02-12 ENCOUNTER — Encounter (HOSPITAL_COMMUNITY): Payer: Self-pay

## 2024-02-12 VITALS — BP 133/70 | HR 58 | Temp 97.9°F | Resp 16 | Ht 69.0 in | Wt 163.0 lb

## 2024-02-12 DIAGNOSIS — Z856 Personal history of leukemia: Secondary | ICD-10-CM | POA: Insufficient documentation

## 2024-02-12 DIAGNOSIS — I700 Atherosclerosis of aorta: Secondary | ICD-10-CM | POA: Insufficient documentation

## 2024-02-12 DIAGNOSIS — I352 Nonrheumatic aortic (valve) stenosis with insufficiency: Secondary | ICD-10-CM | POA: Diagnosis not present

## 2024-02-12 DIAGNOSIS — Z01812 Encounter for preprocedural laboratory examination: Secondary | ICD-10-CM | POA: Insufficient documentation

## 2024-02-12 DIAGNOSIS — R001 Bradycardia, unspecified: Secondary | ICD-10-CM | POA: Insufficient documentation

## 2024-02-12 DIAGNOSIS — F17200 Nicotine dependence, unspecified, uncomplicated: Secondary | ICD-10-CM | POA: Diagnosis not present

## 2024-02-12 DIAGNOSIS — Z8546 Personal history of malignant neoplasm of prostate: Secondary | ICD-10-CM | POA: Insufficient documentation

## 2024-02-12 DIAGNOSIS — I10 Essential (primary) hypertension: Secondary | ICD-10-CM

## 2024-02-12 DIAGNOSIS — C679 Malignant neoplasm of bladder, unspecified: Secondary | ICD-10-CM | POA: Diagnosis not present

## 2024-02-12 DIAGNOSIS — I471 Supraventricular tachycardia, unspecified: Secondary | ICD-10-CM | POA: Insufficient documentation

## 2024-02-12 DIAGNOSIS — I2510 Atherosclerotic heart disease of native coronary artery without angina pectoris: Secondary | ICD-10-CM | POA: Insufficient documentation

## 2024-02-12 DIAGNOSIS — J449 Chronic obstructive pulmonary disease, unspecified: Secondary | ICD-10-CM | POA: Diagnosis not present

## 2024-02-12 HISTORY — DX: Unspecified asthma, uncomplicated: J45.909

## 2024-02-12 LAB — CBC
HCT: 44.7 % (ref 39.0–52.0)
Hemoglobin: 14.1 g/dL (ref 13.0–17.0)
MCH: 28.9 pg (ref 26.0–34.0)
MCHC: 31.5 g/dL (ref 30.0–36.0)
MCV: 91.6 fL (ref 80.0–100.0)
Platelets: 272 10*3/uL (ref 150–400)
RBC: 4.88 MIL/uL (ref 4.22–5.81)
RDW: 13.1 % (ref 11.5–15.5)
WBC: 13.8 10*3/uL — ABNORMAL HIGH (ref 4.0–10.5)
nRBC: 0 % (ref 0.0–0.2)

## 2024-02-12 LAB — BASIC METABOLIC PANEL
Anion gap: 8 (ref 5–15)
BUN: 22 mg/dL (ref 8–23)
CO2: 25 mmol/L (ref 22–32)
Calcium: 8.7 mg/dL — ABNORMAL LOW (ref 8.9–10.3)
Chloride: 106 mmol/L (ref 98–111)
Creatinine, Ser: 1.1 mg/dL (ref 0.61–1.24)
GFR, Estimated: >60 mL/min (ref >60)
Glucose, Bld: 101 mg/dL — ABNORMAL HIGH (ref 70–99)
Potassium: 4.1 mmol/L (ref 3.5–5.1)
Sodium: 139 mmol/L (ref 135–145)

## 2024-02-13 ENCOUNTER — Encounter (HOSPITAL_COMMUNITY): Payer: Self-pay

## 2024-02-13 DIAGNOSIS — I10 Essential (primary) hypertension: Secondary | ICD-10-CM | POA: Diagnosis not present

## 2024-02-13 NOTE — Progress Notes (Signed)
 Case: [id number] Date/Time: 02/20/24 0915   Procedure: TURBT, WITH CHEMOTHERAPEUTIC AGENT INSTILLATION INTO BLADDER   Anesthesia type: General   Pre-op diagnosis: BLADDER CANCER   Location: [facility] PROC[facility]   Surgeons: Noel Christmas, MD       DISCUSSION: Dominic Butler is an 83 yo male who presents to PAT prior to surgery above. 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 He had some episodes of lightheadedness and due to bradycardia was referred to EP for possible PPM. Seen on 08/21/23 by Dr. Graciela Husbands and pacing deferred since it was felt his symptoms were felt to be related to orthostasis and not bradycardia. Last seen by general cardiology on 10/03/23. All issues stable/being monitored. Tolerating medical therapy but continues to smoke.  Patient with hx of CLL, followed by Oncology. Last seen on 05/31/23. Per Dr. Candise Che: "Patient has no new symptoms suggestive of symptomatic CLL progression.  No significant palpable lymphadenopathy or hepatosplenomegaly. No indication for treatment of the patient's CLL at this time."  Follows with PCP. Last seen on 12/04/23. All issues stable.  VS: BP 133/70   Pulse (!) 58   Temp 36.6 C (Oral)   Resp 16   Ht 5\' 9"  (1.753 m)   Wt 73.9 kg   SpO2 95%   BMI 24.07 kg/m   PROVIDERS: Carin Hock, PA   LABS: Labs reviewed: Acceptable for surgery. WBC stable (all labs ordered are listed, but only abnormal results are displayed)  Labs Reviewed  BASIC METABOLIC PANEL - Abnormal; Notable for the following components:      Result Value   Glucose, Bld 101 (*)    Calcium 8.7 (*)    All other components within normal limits  CBC - Abnormal; Notable for the following components:   WBC 13.8 (*)    All other components within normal limits      IMAGES:  CT Chest 09/27/23:  IMPRESSION: 1. Unchanged pulmonary nodules, largest measuring 8 mm in the right lower lobe. This exam constitutes 9 months of imaging stability. Follow-up exam in 12 months is recommended given patient's history of smoking. 2. Coronary artery calcifications.   Aortic Atherosclerosis (ICD10-I70.0) and Emphysema (ICD10-J43.9).  EKG 10/03/23 Sinus bradycardia, rate 48 Left axis deviation Right bundle branch block Minimal voltage criteria for LVH, may be normal variant ( R in aVL ) Septal infarct (cited on or before 23-May-2023) When compared with ECG of 21-Aug-2023 10:14, QRS axis Shifted left Questionable change in initial forces of Septal leads T wave inversion now evident in Inferior leads  CV: Cardiac monitor 06/08/2023:    12 episodes of SVT, longest lasting 14 beats     Patch Wear Time:  5 days and 18 hours (2024-07-06T17:07:43-0400 to 2024-07-12T11:23:48-0400)   Patient had a min HR of 45 bpm, max HR of 143 bpm, and avg HR of 65 bpm. Predominant underlying rhythm was Sinus Rhythm. Bundle Branch Block/IVCD was present. 12 Supraventricular Tachycardia runs occurred, the run with the fastest interval lasting 5  beats with a max rate of 143 bpm, the longest lasting 14 beats with an avg rate of 112 bpm. Isolated SVEs were rare (<1.0%), SVE Couplets were rare (<1.0%), and SVE Triplets were rare (<1.0%). Isolated VEs were rare (<1.0%), VE Couplets were rare  (<1.0%), and no VE Triplets were present.  No patient  triggered events  CT calcium score 01/04/2023:  IMPRESSION: 1. Right lower lobe pulmonary nodule of 8 mm. Non-contrast chest CT at 6-12 months is recommended. If the nodule is stable at time of repeat CT, then future CT at 18-24 months (from today's scan) is considered optional for low-risk patients, but is recommended for high-risk patients. This recommendation follows the consensus statement: Guidelines for Management of Incidental  Pulmonary Nodules Detected on CT Images: From the Fleischner Society 2017; Radiology 2017; 284:228-243. 2.  Aortic Atherosclerosis (ICD10-I70.0). 3. Aortic valvular calcifications. Consider echocardiography to evaluate for valvular dysfunction.  Echo 01/04/2023:  IMPRESSIONS    1. Left ventricular ejection fraction, by estimation, is 60 to 65%. The left ventricle has normal function. The left ventricle has no regional wall motion abnormalities. There is mild concentric left ventricular hypertrophy. Left ventricular diastolic parameters are indeterminate.  2. Right ventricular systolic function is normal. The right ventricular size is normal. There is normal pulmonary artery systolic pressure.  3. Left atrial size was moderately dilated.  4. Right atrial size was mildly dilated.  5. The mitral valve is grossly normal. No evidence of mitral valve regurgitation. No evidence of mitral stenosis.  6. The aortic valve is calcified. There is moderate calcification of the aortic valve. There is moderate thickening of the aortic valve. Aortic valve regurgitation is trivial. Mild aortic valve stenosis. Aortic valve area, by VTI measures 1.87 cm. Aortic valve mean gradient measures 17.7 mmHg. Aortic valve Vmax measures 2.87 m/s.  7. The inferior vena cava is normal in size with greater than 50% respiratory variability, suggesting right atrial pressure of 3 mmHg.  Comparison(s): No significant change from prior study. EF 60%, trivial AI, mild AS mean 9.5, peak 19.9 mmHg.  Past Medical History:  Diagnosis Date   Allergy    Asthma    as child   Benign hematuria    chronic issue for last 30 years, had seen urology and done all the studies, no known cause   Bradycardia    Cancer (HCC)    Prostate cancer status post radiation seeds   Elevated prostate specific antigen (PSA)    has had seed implants and also seen by [facility]   History of kidney stones    Hypertension    Mild  aortic stenosis    Neuromuscular disorder (HCC)     Past Surgical History:  Procedure Laterality Date   APPENDECTOMY     CHOLECYSTECTOMY     INSERTION PROSTATE RADIATION SEED     PROSTATE BIOPSY     SHOULDER SURGERY     TRANSURETHRAL RESECTION OF BLADDER TUMOR [name] 01/09/2024   Procedure: TRANSURETHRAL RESECTION OF BLADDER TUMOR (TURBT);  Surgeon: Noel Christmas, MD;  Location: [facility];  Service: Urology;  Laterality: [name];  30 MINUTES NEEDED    MEDICATIONS:  ibuprofen (ADVIL) 200 MG tablet   amLODipine (NORVASC) 10 MG tablet   atorvastatin (LIPITOR) 20 MG tablet   hyoscyamine (ANASPAZ) 0.125 MG TBDP disintergrating tablet   hyoscyamine (ANASPAZ) 0.125 MG TBDP disintergrating tablet   traMADol (ULTRAM) 50 MG tablet   traMADol (ULTRAM) 50 MG tablet   No current facility-administered medications for this encounter.   Marcille Blanco [facility] Short Stay/Anesthesiology [facility] Phone [phone] 02/13/2024 12:03 PM

## 2024-02-13 NOTE — Anesthesia Preprocedure Evaluation (Addendum)
 Anesthesia Evaluation  Patient identified by MRN, date of birth, ID band Patient awake    Reviewed: Allergy & Precautions, NPO status , Patient's Chart, lab work & pertinent test results  History of Anesthesia Complications Negative for: history of anesthetic complications  Airway Mallampati: I  TM Distance: >3 FB Neck ROM: Full    Dental no notable dental hx. (+) Edentulous Upper, Edentulous Lower   Pulmonary asthma , Current Smoker and Patient abstained from smoking.   Pulmonary exam normal breath sounds clear to auscultation       Cardiovascular hypertension, Pt. on medications (-) angina + Valvular Problems/Murmurs (mild AS) AS  Rhythm:Regular Rate:Normal + Systolic murmurs Echo 2024 1. Left ventricular ejection fraction, by estimation, is 60 to 65%. The  left ventricle has normal function. The left ventricle has no regional  wall motion abnormalities. There is mild concentric left ventricular  hypertrophy. Left ventricular diastolic  parameters are indeterminate.   2. Right ventricular systolic function is normal. The right ventricular  size is normal. There is normal pulmonary artery systolic pressure.   3. Left atrial size was moderately dilated.   4. Right atrial size was mildly dilated.   5. The mitral valve is grossly normal. No evidence of mitral valve  regurgitation. No evidence of mitral stenosis.   6. The aortic valve is calcified. There is moderate calcification of the  aortic valve. There is moderate thickening of the aortic valve. Aortic  valve regurgitation is trivial. Mild aortic valve stenosis. Aortic valve  area, by VTI measures 1.87 cm.  Aortic valve mean gradient measures 17.7 mmHg. Aortic valve Vmax measures  2.87 m/s.   7. The inferior vena cava is normal in size with greater than 50%  respiratory variability, suggesting right atrial pressure of 3 mmHg.   follows with Cardiology for bradycardia and  mild AS. He had some episodes of lightheadedness and due to bradycardia was referred to EP for possible PPM. Seen on 08/21/23 by Dr. Graciela Husbands and pacing deferred since it was felt his symptoms were felt to be related to orthostasis and not bradycardia. Last seen by general cardiology on 10/03/23.    Neuro/Psych negative neurological ROS  negative psych ROS   GI/Hepatic negative GI ROS, Neg liver ROS,,,  Endo/Other  negative endocrine ROS    Renal/GU negative Renal ROS Bladder dysfunction (bladder ca)      Musculoskeletal negative musculoskeletal ROS (+)    Abdominal   Peds  Hematology Hb 14.1, plt 272 Hx CLL   Anesthesia Other Findings   Reproductive/Obstetrics negative OB ROS                             Anesthesia Physical Anesthesia Plan  ASA: 2  Anesthesia Plan: General   Post-op Pain Management: Tylenol PO (pre-op)*   Induction: Intravenous  PONV Risk Score and Plan: 2 and Ondansetron, Dexamethasone and Treatment may vary due to age or medical condition  Airway Management Planned: LMA  Additional Equipment: None  Intra-op Plan:   Post-operative Plan:   Informed Consent: I have reviewed the patients History and Physical, chart, labs and discussed the procedure including the risks, benefits and alternatives for the proposed anesthesia with the patient or authorized representative who has indicated his/her understanding and acceptance.     Dental advisory given  Plan Discussed with: CRNA and Surgeon  Anesthesia Plan Comments:         Anesthesia Quick Evaluation

## 2024-02-19 ENCOUNTER — Other Ambulatory Visit: Payer: Self-pay | Admitting: Urology

## 2024-02-19 DIAGNOSIS — I10 Essential (primary) hypertension: Secondary | ICD-10-CM | POA: Diagnosis not present

## 2024-02-19 DIAGNOSIS — E785 Hyperlipidemia, unspecified: Secondary | ICD-10-CM | POA: Diagnosis not present

## 2024-02-19 NOTE — H&P (Signed)
 CC/HPI: cc: Gross hematuria   11/20/2023: 83 year old man with a longstanding history of microscopic hematuria and a negative evaluation about 25 years ago also with a history of prostate cancer treated with brachytherapy in 2009 comes in with several days of dark urine followed by 1 episode of painless gross hematuria that occurred in November 2024. Patient denies kidney stones or flank pain. He smoked a pipe for about 60 years. 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 He is not very bothered by his urinary symptoms. PSA <1 in July 2024.   IPSS: 0, 2, 0, 4, 0, 0, 1= 7/35  1/6 pleased quality-of-life   12/27/23: Here for cysto as part of gh evaluation. CT showed a bladder mass.   01/16/2024: 83 year old man found to have a bladder mass underwent TURBT on 01/09/2024. Pathology report showed high-grade T1 without muscle present in the specimen. He is experiencing some pressure in his perineum and bladder.     ALLERGIES: No Allergies    MEDICATIONS: Lipitor 10 mg tablet  Amlodipine Besylate 10 mg tablet     GU PSH: Cystoscopy - 12/27/2023       PSH Notes: Laparoscopy Repair Of Initial Inguinal Hernia, Cholecystectomy Laparoscopic, Shoulder Surgery, Appendectomy   NON-GU PSH: Appendectomy - 2008 Cholecystectomy (laparoscopic) - 2009 Colonoscopy Inguinal hernia repair (laparoscopic) - 2009 Shoulder Arthroscopy/surgery     GU PMH: Bladder tumor/neoplasm - 12/27/2023 Gross hematuria - 12/27/2023, - 12/06/2023, - 11/20/2023 History of prostate cancer - 12/06/2023, - 11/20/2023 Elevated PSA, Elevated prostate specific antigen (PSA) - 2014      PMH Notes: Osteoarthritis  H/O prostate CA with previous brachytherapy (seeds). NO oncology f/u for years.   NON-GU PMH: Asthma, Asthma - 2014 Cardiac murmur, unspecified Encounter for general adult medical examination without abnormal findings, Encounter for preventive health examination Hypercholesterolemia Hypertension    FAMILY HISTORY: Breast Cancer  - Runs In Family Chronic Liver Disease - Father Cirrhosis - Mother liver cancer - Runs In Family Lung Cancer - Father   SOCIAL HISTORY: Marital Status: Divorced Preferred Language: English; Ethnicity: Not Hispanic Or Latino; Race: White Current Smoking Status: Patient smokes.   Tobacco Use Assessment Completed: Used Tobacco in last 30 days? Does not drink anymore.  Drinks 4+ caffeinated drinks per day.     Notes: Alcohol Use, Occupation:, Marital History - Divorced, Tobacco Use   REVIEW OF SYSTEMS:    GU Review Male:   Patient denies frequent urination, hard to postpone urination, burning/ pain with urination, get up at night to urinate, leakage of urine, stream starts and stops, trouble starting your stream, have to strain to urinate , erection problems, and penile pain.  Gastrointestinal (Upper):   Patient denies nausea, vomiting, and indigestion/ heartburn.  Gastrointestinal (Lower):   Patient denies diarrhea and constipation.  Constitutional:   Patient denies fever, night sweats, weight loss, and fatigue.  Skin:   Patient denies skin rash/ lesion and itching.  Eyes:   Patient denies blurred vision and double vision.  Ears/ Nose/ Throat:   Patient denies sore throat and sinus problems.  Hematologic/Lymphatic:   Patient denies swollen glands and easy bruising.  Cardiovascular:   Patient denies leg swelling and chest pains.  Respiratory:   Patient denies cough and shortness of breath.  Endocrine:   Patient denies excessive thirst.  Musculoskeletal:   Patient denies back pain and joint pain.  Neurological:   Patient denies dizziness and headaches.  Psychologic:   Patient denies depression and anxiety.   VITAL SIGNS: None  GU PHYSICAL EXAMINATION:      Notes: Yellow urine in bag   MULTI-SYSTEM PHYSICAL EXAMINATION:    Constitutional: Well-nourished. No physical deformities. Normally developed. Good grooming.  Neck: Neck symmetrical, not swollen. Normal tracheal position.   Respiratory: No labored breathing, no use of accessory muscles.   Skin: No paleness, no jaundice, no cyanosis. No lesion, no ulcer, no rash.  Neurologic / Psychiatric: Oriented to time, oriented to place, oriented to person. No depression, no anxiety, no agitation.  Eyes: Normal conjunctivae. Normal eyelids.  Ears, Nose, Mouth, and Throat: Left ear no scars, no lesions, no masses. Right ear no scars, no lesions, no masses. Nose no scars, no lesions, no masses. Normal hearing. Normal lips.  Musculoskeletal: Normal gait and station of head and neck.     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 He was unable to hold more than 50 cc in his bladder and voided 25 cc. If he is unable to void by 3 PM he will return.  -If restaging TURBT confirms T1 will proceed with intravesical BCG for 6 week induction

## 2024-02-20 ENCOUNTER — Ambulatory Visit (HOSPITAL_BASED_OUTPATIENT_CLINIC_OR_DEPARTMENT_OTHER): Admitting: Anesthesiology

## 2024-02-20 ENCOUNTER — Encounter (HOSPITAL_COMMUNITY): Payer: Self-pay | Admitting: Urology

## 2024-02-20 ENCOUNTER — Encounter (HOSPITAL_COMMUNITY)
Admission: RE | Disposition: A | Discharge status: Home / Self Care | Payer: Self-pay | Source: Home / Self Care | Attending: Urology

## 2024-02-20 ENCOUNTER — Ambulatory Visit (HOSPITAL_COMMUNITY): Payer: Self-pay | Admitting: Medical

## 2024-02-20 ENCOUNTER — Ambulatory Visit (HOSPITAL_COMMUNITY)
Admission: RE | Admit: 2024-02-20 | Discharge: 2024-02-20 | Disposition: A | Discharge status: Home / Self Care | Attending: Urology | Admitting: Urology

## 2024-02-20 ENCOUNTER — Other Ambulatory Visit: Payer: Self-pay

## 2024-02-20 DIAGNOSIS — I10 Essential (primary) hypertension: Secondary | ICD-10-CM | POA: Insufficient documentation

## 2024-02-20 DIAGNOSIS — N3289 Other specified disorders of bladder: Secondary | ICD-10-CM | POA: Diagnosis not present

## 2024-02-20 DIAGNOSIS — J45909 Unspecified asthma, uncomplicated: Secondary | ICD-10-CM | POA: Diagnosis not present

## 2024-02-20 DIAGNOSIS — I350 Nonrheumatic aortic (valve) stenosis: Secondary | ICD-10-CM | POA: Insufficient documentation

## 2024-02-20 DIAGNOSIS — C9110 Chronic lymphocytic leukemia of B-cell type not having achieved remission: Secondary | ICD-10-CM | POA: Diagnosis not present

## 2024-02-20 DIAGNOSIS — C672 Malignant neoplasm of lateral wall of bladder: Secondary | ICD-10-CM | POA: Diagnosis not present

## 2024-02-20 DIAGNOSIS — Z8546 Personal history of malignant neoplasm of prostate: Secondary | ICD-10-CM | POA: Diagnosis not present

## 2024-02-20 DIAGNOSIS — R310 Gross hematuria: Secondary | ICD-10-CM | POA: Insufficient documentation

## 2024-02-20 DIAGNOSIS — N400 Enlarged prostate without lower urinary tract symptoms: Secondary | ICD-10-CM | POA: Insufficient documentation

## 2024-02-20 DIAGNOSIS — C679 Malignant neoplasm of bladder, unspecified: Secondary | ICD-10-CM

## 2024-02-20 DIAGNOSIS — F17200 Nicotine dependence, unspecified, uncomplicated: Secondary | ICD-10-CM | POA: Diagnosis not present

## 2024-02-20 DIAGNOSIS — C678 Malignant neoplasm of overlapping sites of bladder: Secondary | ICD-10-CM | POA: Diagnosis not present

## 2024-02-20 SURGERY — TURBT, WITH CHEMOTHERAPEUTIC AGENT INSTILLATION INTO BLADDER
Anesthesia: General

## 2024-02-20 MED ORDER — FENTANYL CITRATE (PF) 100 MCG/2ML IJ SOLN
INTRAMUSCULAR | Status: DC | PRN
  Administered 2024-02-20: 50 ug via INTRAVENOUS

## 2024-02-20 MED ORDER — SUGAMMADEX SODIUM 200 MG/2ML IV SOLN
INTRAVENOUS | Status: AC
  Filled 2024-02-20: qty 2

## 2024-02-20 MED ORDER — FENTANYL CITRATE PF 50 MCG/ML IJ SOSY
25.0000 ug | PREFILLED_SYRINGE | INTRAMUSCULAR | Status: DC | PRN
  Administered 2024-02-20: 50 ug via INTRAVENOUS

## 2024-02-20 MED ORDER — OXYCODONE HCL 5 MG PO TABS: 5.0000 mg | ORAL_TABLET | Freq: Once | ORAL | Status: DC | PRN

## 2024-02-20 MED ORDER — ONDANSETRON HCL 4 MG/2ML IJ SOLN
INTRAMUSCULAR | Status: AC
  Filled 2024-02-20: qty 2

## 2024-02-20 MED ORDER — FENTANYL CITRATE PF 50 MCG/ML IJ SOSY
PREFILLED_SYRINGE | INTRAMUSCULAR | Status: AC
  Filled 2024-02-20: qty 1

## 2024-02-20 MED ORDER — ORAL CARE MOUTH RINSE: 15.0000 mL | Freq: Once | OROMUCOSAL | Status: AC

## 2024-02-20 MED ORDER — FENTANYL CITRATE (PF) 100 MCG/2ML IJ SOLN
INTRAMUSCULAR | Status: AC
  Filled 2024-02-20: qty 2

## 2024-02-20 MED ORDER — ROCURONIUM BROMIDE 10 MG/ML (PF) SYRINGE
PREFILLED_SYRINGE | INTRAVENOUS | Status: AC
  Filled 2024-02-20: qty 10

## 2024-02-20 MED ORDER — SUCCINYLCHOLINE CHLORIDE 200 MG/10ML IV SOSY
PREFILLED_SYRINGE | INTRAVENOUS | Status: AC
  Filled 2024-02-20: qty 10

## 2024-02-20 MED ORDER — PHENYLEPHRINE 80 MCG/ML (10ML) SYRINGE FOR IV PUSH (FOR BLOOD PRESSURE SUPPORT)
PREFILLED_SYRINGE | INTRAVENOUS | Status: AC
  Filled 2024-02-20: qty 10

## 2024-02-20 MED ORDER — LIDOCAINE HCL (PF) 2 % IJ SOLN
INTRAMUSCULAR | Status: AC
  Filled 2024-02-20: qty 5

## 2024-02-20 MED ORDER — STERILE WATER FOR IRRIGATION IR SOLN
Status: DC | PRN
  Administered 2024-02-20: 3000 mL

## 2024-02-20 MED ORDER — 0.9 % SODIUM CHLORIDE (POUR BTL) OPTIME
TOPICAL | Status: DC | PRN
  Administered 2024-02-20: 1000 mL

## 2024-02-20 MED ORDER — CHLORHEXIDINE GLUCONATE 0.12 % MT SOLN
15.0000 mL | Freq: Once | OROMUCOSAL | Status: AC
  Administered 2024-02-20: 15 mL via OROMUCOSAL

## 2024-02-20 MED ORDER — LIDOCAINE HCL (PF) 2 % IJ SOLN
INTRAMUSCULAR | Status: DC | PRN
  Administered 2024-02-20: 60 mg via INTRADERMAL

## 2024-02-20 MED ORDER — ACETAMINOPHEN 500 MG PO TABS: 1000.0000 mg | ORAL_TABLET | Freq: Once | ORAL | Status: DC

## 2024-02-20 MED ORDER — OXYCODONE HCL 5 MG/5ML PO SOLN: 5.0000 mg | Freq: Once | ORAL | Status: DC | PRN

## 2024-02-20 MED ORDER — AMISULPRIDE (ANTIEMETIC) 5 MG/2ML IV SOLN: 10.0000 mg | Freq: Once | INTRAVENOUS | Status: DC | PRN

## 2024-02-20 MED ORDER — DEXAMETHASONE SODIUM PHOSPHATE 10 MG/ML IJ SOLN
INTRAMUSCULAR | Status: DC | PRN
  Administered 2024-02-20: 8 mg via INTRAVENOUS

## 2024-02-20 MED ORDER — PROPOFOL 10 MG/ML IV BOLUS
INTRAVENOUS | Status: DC | PRN
  Administered 2024-02-20: 100 mg via INTRAVENOUS

## 2024-02-20 MED ORDER — CEFAZOLIN SODIUM-DEXTROSE 2-4 GM/100ML-% IV SOLN
2.0000 g | Freq: Once | INTRAVENOUS | Status: AC
  Administered 2024-02-20: 2 g via INTRAVENOUS
  Filled 2024-02-20: qty 100

## 2024-02-20 MED ORDER — SUCCINYLCHOLINE CHLORIDE 200 MG/10ML IV SOSY
PREFILLED_SYRINGE | INTRAVENOUS | Status: DC | PRN
  Administered 2024-02-20: 100 mg via INTRAVENOUS

## 2024-02-20 MED ORDER — ACETAMINOPHEN 500 MG PO TABS
1000.0000 mg | ORAL_TABLET | Freq: Once | ORAL | Status: AC
  Administered 2024-02-20: 1000 mg via ORAL
  Filled 2024-02-20: qty 2

## 2024-02-20 MED ORDER — MIDAZOLAM HCL 2 MG/2ML IJ SOLN
INTRAMUSCULAR | Status: AC
  Filled 2024-02-20: qty 2

## 2024-02-20 MED ORDER — ONDANSETRON HCL 4 MG/2ML IJ SOLN
INTRAMUSCULAR | Status: DC | PRN
  Administered 2024-02-20: 4 mg via INTRAVENOUS

## 2024-02-20 MED ORDER — GEMCITABINE CHEMO FOR BLADDER INSTILLATION 2000 MG
2000.0000 mg | Freq: Once | INTRAVENOUS | Status: AC
  Administered 2024-02-20: 2000 mg via INTRAVESICAL
  Filled 2024-02-20: qty 2000

## 2024-02-20 MED ORDER — ONDANSETRON HCL 4 MG/2ML IJ SOLN: 4.0000 mg | Freq: Once | INTRAMUSCULAR | Status: DC | PRN

## 2024-02-20 MED ORDER — SODIUM CHLORIDE 0.9 % IR SOLN
Status: DC | PRN
  Administered 2024-02-20: 9000 mL

## 2024-02-20 MED ORDER — DEXAMETHASONE SODIUM PHOSPHATE 10 MG/ML IJ SOLN
INTRAMUSCULAR | Status: AC
  Filled 2024-02-20: qty 1

## 2024-02-20 MED ORDER — LACTATED RINGERS IV SOLN: INTRAVENOUS | Status: DC

## 2024-02-20 SURGICAL SUPPLY — 17 items
BAG URINE DRAIN 2000ML AR STRL (UROLOGICAL SUPPLIES) IMPLANT
BAG URO CATCHER STRL LF (MISCELLANEOUS) ×1 IMPLANT
CATH FOLEY 2WAY SLVR 5CC 18FR (CATHETERS) IMPLANT
CLOTH BEACON ORANGE TIMEOUT ST (SAFETY) ×1 IMPLANT
DRAPE FOOT SWITCH (DRAPES) ×1 IMPLANT
ELECT REM PT RETURN 15FT ADLT (MISCELLANEOUS) IMPLANT
GLOVE BIO SURGEON STRL SZ 6.5 (GLOVE) ×1 IMPLANT
GOWN STRL REUS W/ TWL LRG LVL3 (GOWN DISPOSABLE) ×1 IMPLANT
KIT TURNOVER KIT A (KITS) IMPLANT
LOOP CUT BIPOLAR 24F LRG (ELECTROSURGICAL) IMPLANT
MANIFOLD NEPTUNE II (INSTRUMENTS) ×1 IMPLANT
PACK CYSTO (CUSTOM PROCEDURE TRAY) ×1 IMPLANT
PAD PREP 24X48 CUFFED NSTRL (MISCELLANEOUS) ×1 IMPLANT
SYR TOOMEY IRRIG 70ML (MISCELLANEOUS) ×1 IMPLANT
SYRINGE TOOMEY IRRIG 70ML (MISCELLANEOUS) IMPLANT
TUBING CONNECTING 10 (TUBING) ×1 IMPLANT
TUBING UROLOGY SET (TUBING) ×1 IMPLANT

## 2024-02-20 NOTE — Discharge Instructions (Signed)
Post Bladder Surgery Instructions   General instructions:     Your recent bladder surgery requires very little post hospital care but some definite precautions.  Despite the fact that no skin incisions were used, the area around the bladder incisions are raw and covered with scabs to promote healing and prevent bleeding. Certain precautions are needed to insure that the scabs are not disturbed over the next 2-4 weeks while the healing proceeds.  Because the raw surface inside your bladder and the irritating effects of urine you may expect frequency of urination and/or urgency (a stronger desire to urinate) and perhaps even getting up at night more often. This will usually resolve or improve slowly over the healing period. You may see some blood in your urine over the first 6 weeks. Do not be alarmed, even if the urine was clear for a while. Get off your feet and drink lots of fluids until clearing occurs. If you start to pass clots or don't improve call us.  Catheter: (If you are discharged with a catheter.)  1. Keep your catheter secured to your leg at all times with tape or the supplied strap. 2. You may experience leakage of urine around your catheter- as long as the  catheter continues to drain, this is normal.  If your catheter stops draining  go to the ER. 3. You may also have blood in your urine, even after it has been clear for  several days; you may even pass some small blood clots or other material.  This  is normal as well.  If this happens, sit down and drink plenty of water to help  make urine to flush out your bladder.  If the blood in your urine becomes worse  after doing this, contact our office or return to the ER. 4. You may use the leg bag (small bag) during the day, but use the large bag at  night.  Diet:  You may return to your normal diet immediately. Because of the raw surface of your bladder, alcohol, spicy foods, foods high in acid and drinks with caffeine may  cause irritation or frequency and should be used in moderation. To keep your urine flowing freely and avoid constipation, drink plenty of fluids during the day (8-10 glasses). Tip: Avoid cranberry juice because it is very acidic.  Activity:  Your physical activity doesn't need to be restricted. However, if you are very active, you may see some blood in the urine. We suggest that you reduce your activity under the circumstances until the bleeding has stopped.  Bowels:  It is important to keep your bowels regular during the postoperative period. Straining with bowel movements can cause bleeding. A bowel movement every other day is reasonable. Use a mild laxative if needed, such as milk of magnesia 2-3 tablespoons, or 2 Dulcolax tablets. Call if you continue to have problems. If you had been taking narcotics for pain, before, during or after your surgery, you may be constipated. Take a laxative if necessary.    Medication:  You should resume your pre-surgery medications unless told not to. In addition you may be given an antibiotic to prevent or treat infection. Antibiotics are not always necessary. All medication should be taken as prescribed until the bottles are finished unless you are having an unusual reaction to one of the drugs.   

## 2024-02-20 NOTE — Anesthesia Postprocedure Evaluation (Signed)
 Anesthesia Post Note  Patient: Dominic Butler  Procedure(s) Performed: TURBT, WITH CHEMOTHERAPEUTIC AGENT INSTILLATION INTO BLADDER     Patient location during evaluation: PACU Anesthesia Type: General Level of consciousness: awake and alert, patient cooperative and oriented Pain management: pain level controlled Vital Signs Assessment: post-procedure vital signs reviewed and stable Respiratory status: spontaneous breathing, nonlabored ventilation and respiratory function stable Cardiovascular status: blood pressure returned to baseline and stable Postop Assessment: no apparent nausea or vomiting Anesthetic complications: no   No notable events documented.  Last Vitals:  Vitals:   02/20/24 1215 02/20/24 1225  BP: (!) 130/105 133/67  Pulse: (!) 49 (!) 46  Resp: 14 10  Temp:  (!) 36.3 C  SpO2: (!) 85% 97%    Last Pain:  Vitals:   02/20/24 1225  TempSrc:   PainSc: 0-No pain                 Crystal Ellwood,E. Javionna Leder

## 2024-02-20 NOTE — Transfer of Care (Signed)
 Immediate Anesthesia Transfer of Care Note  Patient: Dominic Butler  Procedure(s) Performed: TURBT, WITH CHEMOTHERAPEUTIC AGENT INSTILLATION INTO BLADDER  Patient Location: PACU  Anesthesia Type:General  Level of Consciousness: awake and drowsy  Airway & Oxygen Therapy: Patient Spontanous Breathing and Patient connected to face mask oxygen  Post-op Assessment: Report given to RN and Post -op Vital signs reviewed and stable  Post vital signs: Reviewed and stable  Last Vitals:  Vitals Value Taken Time  BP 142/63   Temp    Pulse 49 02/20/24 1053  Resp 16   SpO2 99 % 02/20/24 1053  Vitals shown include unfiled device data.  Last Pain:  Vitals:   02/20/24 0813  TempSrc:   PainSc: 0-No pain         Complications: No notable events documented.

## 2024-02-20 NOTE — Op Note (Signed)
 Operative Note  Preoperative diagnosis:  1.  High-grade T1 bladder cancer  Postoperative diagnosis: 1.  Same  Procedure(s): 1.  Restaging TURBT 2.  Intravesical gemcitabine 3.  Urethral dilation 22Fr - 28Fr with sounds  Surgeon: Kasandra Knudsen, MD  Assistants:  None  Anesthesia:  General  Complications:  None  EBL:  minimal  Specimens: 1. Bladder biopsy  Drains/Catheters: 1.  18Fr foley catheter  Intraoperative findings:   Normal anterior urethra Prostatic urethral with obstructing lateral lobes Left lateral wall prior resection site with fibrinous exudate and sloughing mucosa Cold cup bladder biopsy taken from base Repeat TURBT of irregular lateral margin  Indication:  Dominic Butler is a 83 y.o. male with HG T1 bladder cancer here for restaging TURBT.   Description of procedure: After risks and benefits were discussed, informed consent obtained. The patient was taken to the operating room and administered general anesthesia. They were then placed on the table and moved to the dorsal lithotomy position after which the genitalia was sterilely prepped and draped. An official timeout was then performed.  A 21 French rigid cystoscope was placed in the meatus and advanced in the bladder under direct visualization.  The previous resection site in the left lateral wall was identified.  There is fibrinous exudate and sloughing of tissue seen.  A cold cup bladder biopsy was then obtained from the base.  The cystoscope was removed and the resectoscope was assembled.  Urethral sounds were used to dilate the urethral meatus from 22 [place] to 28 [place].  Next the resectoscope using the visual obturator was advanced into the bladder.  The bipolar electrocautery loop was assembled with the working element and resection took place of the residual bladder tumor noted at the lateral margin.  Toomey syringe was used to evacuate all of the specimens.  Hemostasis was achieved with bipolar  electrocautery using the loop.  Hemostasis was adequate with the irrigant turned off.  The resectoscope was removed and the patient emerged from anesthesia.   He was then transferred the PACU in stable condition.  Once in the PACU 2 g of intravesical gemcitabine were instilled through the patient's existing Foley catheter and the catheter was capped.  Chemotherapy precautions were utilized.  Chemotherapy was held the patient's bladder for 1 hour and then drained.    Plan:  Discharge home after intravesical gemcitabine with foley in place.

## 2024-02-20 NOTE — Interval H&P Note (Signed)
 History and Physical Interval Note:  02/20/2024 9:08 AM  Dominic Butler  has presented today for surgery, with the diagnosis of BLADDER CANCER.  The various methods of treatment have been discussed with the patient and family. After consideration of risks, benefits and other options for treatment, the patient has consented to  Procedure(s): TURBT, WITH CHEMOTHERAPEUTIC AGENT INSTILLATION INTO BLADDER ([name]) as a surgical intervention.  The patient's history has been reviewed, patient examined, no change in status, stable for surgery.  I have reviewed the patient's chart and labs.  Questions were answered to the patient's satisfaction.     Chandrika Sandles D Nari Vannatter

## 2024-02-20 NOTE — Anesthesia Procedure Notes (Addendum)
 Procedure Name: Intubation Date/Time: 02/20/2024 10:07 AM  Performed by: Floydene Flock, CRNAPre-anesthesia Checklist: Emergency Drugs available, Suction available, Patient identified and Patient being monitored Patient Re-evaluated:Patient Re-evaluated prior to induction Oxygen Delivery Method: Circle system utilized Preoxygenation: Pre-oxygenation with 100% oxygen Induction Type: IV induction Laryngoscope Size: Mac and 3 Grade View: Grade I Tube type: Oral Tube size: 7.5 mm Airway Equipment and Method: Stylet Placement Confirmation: ETT inserted through vocal cords under direct vision, positive ETCO2 and breath sounds checked- equal and bilateral Secured at: 23 cm Tube secured with: Tape Dental Injury: Teeth and Oropharynx as per pre-operative assessment  Comments: LMA attempted X2 different kinds. Would not reamain seated unless held in place. Removed and inserted ETT. ETT passed easily, atruamatic.

## 2024-02-21 LAB — SURGICAL PATHOLOGY

## 2024-02-26 DIAGNOSIS — C678 Malignant neoplasm of overlapping sites of bladder: Secondary | ICD-10-CM | POA: Diagnosis not present

## 2024-03-07 DIAGNOSIS — I10 Essential (primary) hypertension: Secondary | ICD-10-CM | POA: Diagnosis not present

## 2024-03-07 DIAGNOSIS — C678 Malignant neoplasm of overlapping sites of bladder: Secondary | ICD-10-CM | POA: Diagnosis not present

## 2024-03-07 DIAGNOSIS — C9110 Chronic lymphocytic leukemia of B-cell type not having achieved remission: Secondary | ICD-10-CM | POA: Diagnosis not present

## 2024-03-07 DIAGNOSIS — Z6824 Body mass index (BMI) 24.0-24.9, adult: Secondary | ICD-10-CM | POA: Diagnosis not present

## 2024-03-07 DIAGNOSIS — D510 Vitamin B12 deficiency anemia due to intrinsic factor deficiency: Secondary | ICD-10-CM | POA: Diagnosis not present

## 2024-03-17 DIAGNOSIS — I10 Essential (primary) hypertension: Secondary | ICD-10-CM | POA: Diagnosis not present

## 2024-03-18 DIAGNOSIS — L84 Corns and callosities: Secondary | ICD-10-CM | POA: Diagnosis not present

## 2024-03-18 DIAGNOSIS — Z6824 Body mass index (BMI) 24.0-24.9, adult: Secondary | ICD-10-CM | POA: Diagnosis not present

## 2024-03-20 DIAGNOSIS — I10 Essential (primary) hypertension: Secondary | ICD-10-CM | POA: Diagnosis not present

## 2024-03-20 DIAGNOSIS — E785 Hyperlipidemia, unspecified: Secondary | ICD-10-CM | POA: Diagnosis not present

## 2024-03-27 DIAGNOSIS — C678 Malignant neoplasm of overlapping sites of bladder: Secondary | ICD-10-CM | POA: Diagnosis not present

## 2024-04-03 DIAGNOSIS — C678 Malignant neoplasm of overlapping sites of bladder: Secondary | ICD-10-CM | POA: Diagnosis not present

## 2024-04-10 DIAGNOSIS — C678 Malignant neoplasm of overlapping sites of bladder: Secondary | ICD-10-CM | POA: Diagnosis not present

## 2024-04-10 DIAGNOSIS — Z5111 Encounter for antineoplastic chemotherapy: Secondary | ICD-10-CM | POA: Diagnosis not present

## 2024-04-10 DIAGNOSIS — R8271 Bacteriuria: Secondary | ICD-10-CM | POA: Diagnosis not present

## 2024-04-16 DIAGNOSIS — I10 Essential (primary) hypertension: Secondary | ICD-10-CM | POA: Diagnosis not present

## 2024-04-17 DIAGNOSIS — C678 Malignant neoplasm of overlapping sites of bladder: Secondary | ICD-10-CM | POA: Diagnosis not present

## 2024-04-20 DIAGNOSIS — I10 Essential (primary) hypertension: Secondary | ICD-10-CM | POA: Diagnosis not present

## 2024-04-20 DIAGNOSIS — E785 Hyperlipidemia, unspecified: Secondary | ICD-10-CM | POA: Diagnosis not present

## 2024-04-24 DIAGNOSIS — Z5111 Encounter for antineoplastic chemotherapy: Secondary | ICD-10-CM | POA: Diagnosis not present

## 2024-04-24 DIAGNOSIS — C678 Malignant neoplasm of overlapping sites of bladder: Secondary | ICD-10-CM | POA: Diagnosis not present

## 2024-05-01 DIAGNOSIS — C678 Malignant neoplasm of overlapping sites of bladder: Secondary | ICD-10-CM | POA: Diagnosis not present

## 2024-05-07 ENCOUNTER — Other Ambulatory Visit: Payer: Self-pay | Admitting: Cardiology

## 2024-05-16 DIAGNOSIS — I10 Essential (primary) hypertension: Secondary | ICD-10-CM | POA: Diagnosis not present

## 2024-05-20 DIAGNOSIS — I10 Essential (primary) hypertension: Secondary | ICD-10-CM | POA: Diagnosis not present

## 2024-05-20 DIAGNOSIS — E785 Hyperlipidemia, unspecified: Secondary | ICD-10-CM | POA: Diagnosis not present

## 2024-05-27 DIAGNOSIS — H43393 Other vitreous opacities, bilateral: Secondary | ICD-10-CM | POA: Diagnosis not present

## 2024-05-27 DIAGNOSIS — H35373 Puckering of macula, bilateral: Secondary | ICD-10-CM | POA: Diagnosis not present

## 2024-05-27 DIAGNOSIS — H1132 Conjunctival hemorrhage, left eye: Secondary | ICD-10-CM | POA: Diagnosis not present

## 2024-05-27 DIAGNOSIS — H35363 Drusen (degenerative) of macula, bilateral: Secondary | ICD-10-CM | POA: Diagnosis not present

## 2024-05-31 ENCOUNTER — Other Ambulatory Visit: Payer: Medicare Other

## 2024-05-31 ENCOUNTER — Ambulatory Visit: Payer: Medicare Other | Admitting: Hematology

## 2024-06-04 DIAGNOSIS — Z0000 Encounter for general adult medical examination without abnormal findings: Secondary | ICD-10-CM | POA: Diagnosis not present

## 2024-06-04 DIAGNOSIS — Z125 Encounter for screening for malignant neoplasm of prostate: Secondary | ICD-10-CM | POA: Diagnosis not present

## 2024-06-04 DIAGNOSIS — Z1331 Encounter for screening for depression: Secondary | ICD-10-CM | POA: Diagnosis not present

## 2024-06-04 DIAGNOSIS — Z6824 Body mass index (BMI) 24.0-24.9, adult: Secondary | ICD-10-CM | POA: Diagnosis not present

## 2024-06-14 ENCOUNTER — Other Ambulatory Visit: Payer: Self-pay

## 2024-06-14 DIAGNOSIS — C9110 Chronic lymphocytic leukemia of B-cell type not having achieved remission: Secondary | ICD-10-CM

## 2024-06-16 NOTE — Progress Notes (Signed)
 HEMATOLOGY/ONCOLOGY CLINIC NOTE  Date of Service: 06/17/2024  Patient Care Team: Sheldon Netter, [place] as PCP - General (Physician Assistant) Kate Lonni CROME, MD as PCP - Cardiology (Cardiology) Cleatus Collar, MD as Consulting Physician (Ophthalmology) Lennard Lesta FALCON, MD as Consulting Physician (Gastroenterology)  CHIEF COMPLAINTS/PURPOSE OF CONSULTATION:  Follow-up for continued evaluation and management of CLL  HISTORY OF PRESENTING ILLNESS:  Please see previous note for details  INTERVAL HISTORY:  Dominic Butler is a wonderful 83 y.o. male who is here for follow-up of CLL. Patient was last seen by me on 05/31/2023 and reported an incident of vision issues with dizziness, and also noted a history of low HR in the 50s with no associated symptoms.  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  Continues to follow with his urology team for continued monitoring and management of this.  His primary urologist is Dr. Glendia Elizabeth.  He is here for 1 year follow-up of his CLL.  He notes no new lumps or bumps.  No fevers no chills no night sweats no unexpected weight loss.  No infection issues. Labs done today were reviewed in details.  MEDICAL HISTORY:  Past Medical History:  Diagnosis Date   Allergy    Asthma    as child   Benign hematuria    chronic issue for last 30 years, had seen urology and done all the studies, no known cause   Bradycardia    Cancer (HCC)    Prostate cancer status post radiation seeds   Elevated prostate specific antigen (PSA)    has had seed implants and also seen by [facility]   History of kidney stones    Hypertension    Mild aortic stenosis    Neuromuscular disorder (HCC)     SURGICAL HISTORY: Past Surgical History:  Procedure Laterality Date   APPENDECTOMY     CHOLECYSTECTOMY     INSERTION PROSTATE RADIATION SEED      PROSTATE BIOPSY     SHOULDER SURGERY     TRANSURETHRAL RESECTION OF BLADDER TUMOR [name] 01/09/2024   Procedure: TRANSURETHRAL RESECTION OF BLADDER TUMOR (TURBT);  Surgeon: Elisabeth Valli BIRCH, MD;  Location: [facility];  Service: Urology;  Laterality: [name];  30 MINUTES NEEDED    SOCIAL HISTORY: Social History   Socioeconomic History   Marital status: Divorced    Spouse name: Not on file   Number of children: Not on file   Years of education: Not on file   Highest education level: Not on file  Occupational History   Not on file  Tobacco Use   Smoking status: Every Day    Types: Pipe   Smokeless tobacco: Never  Vaping Use   Vaping status: Never Used  Substance and Sexual Activity   Alcohol  use: No   Drug use: No   Sexual activity: Not on file  Other Topics Concern   Not on file  Social History Narrative   Not on file   Social Drivers of Health   Financial Resource Strain: Not on file  Food Insecurity: No Food Insecurity (10/21/2021)   Received from [facility]   Hunger Vital Sign    Within the past 12 months, you worried that your food would run out before you got the money to buy more.: Never true    Within the past 12 months, the food you bought just didn't last and  you didn't have money to get more.: Never true  Transportation Needs: Not on file  Physical Activity: Not on file  Stress: Not on file  Social Connections: Unknown (03/20/2022)   Received from [facility]   Social Network    Social Network: Not on file  Intimate Partner Violence: Unknown (02/25/2022)   Received from [facility]   HITS    Physically Hurt: Not on file    Insult or Talk Down To: Not on file    Threaten Physical Harm: Not on file    Scream or Curse: Not on file    FAMILY HISTORY: Family History  Problem Relation Age of Onset   Heart attack Mother     ALLERGIES:  has no known allergies.  MEDICATIONS:  Current Outpatient Medications  Medication Sig Dispense Refill   amLODipine   (NORVASC ) 10 MG tablet Take 1 tablet by mouth once daily 90 tablet 1   atorvastatin  (LIPITOR) 20 MG tablet Take 1 tablet by mouth once daily 90 tablet 2   hyoscyamine  (ANASPAZ ) 0.125 MG TBDP disintergrating tablet Place 1 tablet (0.125 mg total) under the tongue every 6 (six) hours as needed. (Patient not taking: Reported on 02/06/2024) 40 tablet 0   ibuprofen (ADVIL) 200 MG tablet Take 200 mg by mouth every 8 (eight) hours as needed for mild pain (pain score 1-3) or moderate pain (pain score 4-6).     traMADol  (ULTRAM ) 50 MG tablet Take 1 tablet (50 mg total) by mouth every 6 (six) hours as needed. (Patient not taking: Reported on 02/06/2024) 20 tablet 0   traMADol  (ULTRAM ) 50 MG tablet Take 1 tablet (50 mg total) by mouth every 6 (six) hours as needed. (Patient not taking: Reported on 02/06/2024) 10 tablet 0   No current facility-administered medications for this visit.    REVIEW OF SYSTEMS:    10 Point review of Systems was done is negative except as noted above.   PHYSICAL EXAMINATION: ECOG PERFORMANCE STATUS: 0 - Asymptomatic  . Vitals:   06/17/24 0953  BP: (!) 163/74  Pulse: (!) 51  Resp: 16  Temp: (!) 97.4 F (36.3 C)  SpO2: 94%    Filed Weights   06/17/24 0953  Weight: 169 lb 12.8 oz (77 kg)    Body mass index is 25.08 kg/m.    GENERAL:alert, in no acute distress and comfortable SKIN: no acute rashes, no significant lesions EYES: conjunctiva are pink and non-injected, sclera anicteric OROPHARYNX: MMM, no exudates, no oropharyngeal erythema or ulceration NECK: supple, no JVD LYMPH:  no palpable lymphadenopathy in the cervical, axillary or inguinal regions LUNGS: clear to auscultation b/l with normal respiratory effort HEART: regular rate & rhythm ABDOMEN:  normoactive bowel sounds , non tender, not distended. Extremity: no pedal edema PSYCH: alert & oriented x 3 with fluent speech NEURO: no focal motor/sensory deficits   LABORATORY DATA:  I have reviewed the  data as listed  .    Latest Ref Rng & Units 06/17/2024    9:38 AM 02/12/2024    1:07 PM 01/02/2024    9:59 AM  CBC  WBC 4.0 - 10.5 K/uL 12.5  13.8  11.2   Hemoglobin 13.0 - 17.0 g/dL 85.3  85.8  85.5   Hematocrit 39.0 - 52.0 % 44.4  44.7  45.3   Platelets 150 - 400 K/uL 232  272  267     .    Latest Ref Rng & Units 06/17/2024    9:38 AM 02/12/2024  1:07 PM 01/02/2024    9:59 AM  CMP  Glucose 70 - 99 mg/dL 896  898  97   BUN 8 - 23 mg/dL 14  22  14    Creatinine 0.61 - 1.24 mg/dL 9.15  8.89  9.07   Sodium 135 - 145 mmol/L 140  139  142   Potassium 3.5 - 5.1 mmol/L 4.1  4.1  4.1   Chloride 98 - 111 mmol/L 107  106  105   CO2 22 - 32 mmol/L 29  25  27    Calcium  8.9 - 10.3 mg/dL 8.7  8.7  9.1   Total Protein 6.5 - 8.1 g/dL 6.5     Total Bilirubin 0.0 - 1.2 mg/dL 0.4     Alkaline Phos 38 - 126 U/L 73     AST 15 - 41 U/L 14     ALT 0 - 44 U/L 8      . 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    RADIOGRAPHIC STUDIES: I have personally reviewed the radiological images as listed and agreed with the findings in the report. No results found.  ASSESSMENT & PLAN:   83 y.o. 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  PLAN:  -Discussed lab results from today, 06/17/2024, in detail with patient - CBC shows stable WBC count of 12.5k with  normal hemoglobin of 14.6 and normal platelets of 232k - CMP stable LDH within normal limits at 128 -Patient has no lab or clinical evidence of CLL progression at this time. - He continues to have in between labs in 6 months with his primary care physician and is seeing us  once a year for CLL evaluation and management. - At this point the patient does not meet any criteria to initiate treatment for CLL at this time. Counseled patient to stay-to speed with his age-appropriate vaccinations and cancer screening with PCP. - Continue follow-up with dermatology once a year for skin screening since he is at increased risk for nonmelanoma skin cancers on account of his CLL especially with the 13 q. Deletion.  2) papillary urothelial bladder cancer. - Status post TURBT and BCG installations. - Continue follow-up with Dr. Adron with urology for continued evaluation and management.    FOLLOW-UP: Return to clinic with Dr. Onesimo with labs in 12 months  The total time spent in the appointment was 25 minutes* .  All of the patient's questions were answered with apparent satisfaction. The patient knows to call the clinic with any problems, questions or concerns.   Emaline Onesimo MD MS AAHIVMS [facility] Hematology/Oncology Physician [facility]  .*Total Encounter Time as defined by the Centers for Medicare and Medicaid Services includes, in addition to the face-to-face time of a patient visit (documented in the note above) non-face-to-face time: obtaining and reviewing outside history, ordering and reviewing medications, tests or procedures, care coordination (communications with other health care professionals or  caregivers) and documentation in the medical record.    I,Mitra Faeizi,acting as a Neurosurgeon for Emaline Saran, MD.,have documented all relevant documentation on the behalf of Emaline Saran, MD,as directed by  Emaline Saran, MD while in the presence of Emaline Saran, MD.  .I have reviewed  the above documentation for accuracy and completeness, and I agree with the above. .Cerinity Zynda Kishore Aliviya Schoeller MD

## 2024-06-17 ENCOUNTER — Inpatient Hospital Stay: Attending: Hematology

## 2024-06-17 ENCOUNTER — Inpatient Hospital Stay (HOSPITAL_BASED_OUTPATIENT_CLINIC_OR_DEPARTMENT_OTHER): Admitting: Hematology

## 2024-06-17 VITALS — BP 163/74 | HR 51 | Temp 97.4°F | Resp 16 | Wt 169.8 lb

## 2024-06-17 DIAGNOSIS — C9110 Chronic lymphocytic leukemia of B-cell type not having achieved remission: Secondary | ICD-10-CM

## 2024-06-17 DIAGNOSIS — F17290 Nicotine dependence, other tobacco product, uncomplicated: Secondary | ICD-10-CM | POA: Insufficient documentation

## 2024-06-17 DIAGNOSIS — Z8546 Personal history of malignant neoplasm of prostate: Secondary | ICD-10-CM | POA: Insufficient documentation

## 2024-06-17 DIAGNOSIS — Z79899 Other long term (current) drug therapy: Secondary | ICD-10-CM | POA: Diagnosis not present

## 2024-06-17 DIAGNOSIS — Z9049 Acquired absence of other specified parts of digestive tract: Secondary | ICD-10-CM | POA: Insufficient documentation

## 2024-06-17 DIAGNOSIS — Z8551 Personal history of malignant neoplasm of bladder: Secondary | ICD-10-CM | POA: Diagnosis not present

## 2024-06-17 LAB — CMP (CANCER CENTER ONLY)
ALT: 8 U/L (ref 0–44)
AST: 14 U/L — ABNORMAL LOW (ref 15–41)
Albumin: 3.7 g/dL (ref 3.5–5.0)
Alkaline Phosphatase: 73 U/L (ref 38–126)
Anion gap: 4 — ABNORMAL LOW (ref 5–15)
BUN: 14 mg/dL (ref 8–23)
CO2: 29 mmol/L (ref 22–32)
Calcium: 8.7 mg/dL — ABNORMAL LOW (ref 8.9–10.3)
Chloride: 107 mmol/L (ref 98–111)
Creatinine: 0.84 mg/dL (ref 0.61–1.24)
GFR, Estimated: >60 mL/min (ref >60)
Glucose, Bld: 103 mg/dL — ABNORMAL HIGH (ref 70–99)
Potassium: 4.1 mmol/L (ref 3.5–5.1)
Sodium: 140 mmol/L (ref 135–145)
Total Bilirubin: 0.4 mg/dL (ref 0.0–1.2)
Total Protein: 6.5 g/dL (ref 6.5–8.1)

## 2024-06-17 LAB — CBC WITH DIFFERENTIAL (CANCER CENTER ONLY)
Abs Immature Granulocytes: 0.03 K/uL (ref 0.00–0.07)
Basophils Absolute: 0.1 K/uL (ref 0.0–0.1)
Basophils Relative: 1 %
Eosinophils Absolute: 0.2 K/uL (ref 0.0–0.5)
Eosinophils Relative: 1 %
HCT: 44.4 % (ref 39.0–52.0)
Hemoglobin: 14.6 g/dL (ref 13.0–17.0)
Immature Granulocytes: 0 %
Lymphocytes Relative: 65 %
Lymphs Abs: 8.2 K/uL — ABNORMAL HIGH (ref 0.7–4.0)
MCH: 29.3 pg (ref 26.0–34.0)
MCHC: 32.9 g/dL (ref 30.0–36.0)
MCV: 89.2 fL (ref 80.0–100.0)
Monocytes Absolute: 0.7 K/uL (ref 0.1–1.0)
Monocytes Relative: 6 %
Neutro Abs: 3.3 K/uL (ref 1.7–7.7)
Neutrophils Relative %: 27 %
Platelet Count: 232 K/uL (ref 150–400)
RBC: 4.98 MIL/uL (ref 4.22–5.81)
RDW: 13.1 % (ref 11.5–15.5)
Smear Review: NORMAL
WBC Count: 12.5 K/uL — ABNORMAL HIGH (ref 4.0–10.5)
nRBC: 0 % (ref 0.0–0.2)

## 2024-06-17 LAB — LACTATE DEHYDROGENASE: LDH: 128 U/L (ref 98–192)

## 2024-06-20 DIAGNOSIS — I10 Essential (primary) hypertension: Secondary | ICD-10-CM | POA: Diagnosis not present

## 2024-06-20 DIAGNOSIS — E785 Hyperlipidemia, unspecified: Secondary | ICD-10-CM | POA: Diagnosis not present

## 2024-07-07 DIAGNOSIS — I10 Essential (primary) hypertension: Secondary | ICD-10-CM | POA: Diagnosis not present

## 2024-07-21 DIAGNOSIS — E785 Hyperlipidemia, unspecified: Secondary | ICD-10-CM | POA: Diagnosis not present

## 2024-07-21 DIAGNOSIS — I10 Essential (primary) hypertension: Secondary | ICD-10-CM | POA: Diagnosis not present

## 2024-08-12 DIAGNOSIS — I10 Essential (primary) hypertension: Secondary | ICD-10-CM | POA: Diagnosis not present

## 2024-08-14 DIAGNOSIS — C678 Malignant neoplasm of overlapping sites of bladder: Secondary | ICD-10-CM | POA: Diagnosis not present

## 2024-08-14 DIAGNOSIS — Z8546 Personal history of malignant neoplasm of prostate: Secondary | ICD-10-CM | POA: Diagnosis not present

## 2024-08-20 DIAGNOSIS — E785 Hyperlipidemia, unspecified: Secondary | ICD-10-CM | POA: Diagnosis not present

## 2024-08-20 DIAGNOSIS — I10 Essential (primary) hypertension: Secondary | ICD-10-CM | POA: Diagnosis not present

## 2024-09-04 DIAGNOSIS — L570 Actinic keratosis: Secondary | ICD-10-CM | POA: Diagnosis not present

## 2024-09-04 DIAGNOSIS — D1801 Hemangioma of skin and subcutaneous tissue: Secondary | ICD-10-CM | POA: Diagnosis not present

## 2024-09-04 DIAGNOSIS — L814 Other melanin hyperpigmentation: Secondary | ICD-10-CM | POA: Diagnosis not present

## 2024-09-04 DIAGNOSIS — D229 Melanocytic nevi, unspecified: Secondary | ICD-10-CM | POA: Diagnosis not present

## 2024-09-04 DIAGNOSIS — L578 Other skin changes due to chronic exposure to nonionizing radiation: Secondary | ICD-10-CM | POA: Diagnosis not present

## 2024-09-04 DIAGNOSIS — L309 Dermatitis, unspecified: Secondary | ICD-10-CM | POA: Diagnosis not present

## 2024-09-04 DIAGNOSIS — L821 Other seborrheic keratosis: Secondary | ICD-10-CM | POA: Diagnosis not present

## 2024-09-05 DIAGNOSIS — C679 Malignant neoplasm of bladder, unspecified: Secondary | ICD-10-CM | POA: Diagnosis not present

## 2024-09-12 DIAGNOSIS — C679 Malignant neoplasm of bladder, unspecified: Secondary | ICD-10-CM | POA: Diagnosis not present

## 2024-09-20 DIAGNOSIS — I10 Essential (primary) hypertension: Secondary | ICD-10-CM | POA: Diagnosis not present

## 2024-09-20 DIAGNOSIS — E785 Hyperlipidemia, unspecified: Secondary | ICD-10-CM | POA: Diagnosis not present

## 2024-09-24 ENCOUNTER — Ambulatory Visit (HOSPITAL_COMMUNITY)
Admission: RE | Admit: 2024-09-24 | Discharge: 2024-09-24 | Disposition: A | Discharge status: Home / Self Care | Payer: Medicare Other | Source: Ambulatory Visit | Attending: Cardiology | Admitting: Cardiology

## 2024-09-24 DIAGNOSIS — R911 Solitary pulmonary nodule: Secondary | ICD-10-CM

## 2024-09-27 ENCOUNTER — Encounter: Payer: Self-pay | Admitting: Cardiology

## 2024-10-20 DIAGNOSIS — E785 Hyperlipidemia, unspecified: Secondary | ICD-10-CM | POA: Diagnosis not present

## 2024-10-20 DIAGNOSIS — I10 Essential (primary) hypertension: Secondary | ICD-10-CM | POA: Diagnosis not present

## 2024-10-21 ENCOUNTER — Other Ambulatory Visit: Payer: Self-pay | Admitting: Cardiology

## 2024-10-25 ENCOUNTER — Other Ambulatory Visit: Payer: Self-pay | Admitting: Cardiology

## 2024-11-03 ENCOUNTER — Other Ambulatory Visit: Payer: Self-pay | Admitting: Cardiology

## 2024-12-11 ENCOUNTER — Ambulatory Visit (HOSPITAL_BASED_OUTPATIENT_CLINIC_OR_DEPARTMENT_OTHER)
Admission: RE | Admit: 2024-12-11 | Discharge: 2024-12-11 | Disposition: A | Discharge status: Home / Self Care | Source: Ambulatory Visit | Attending: Cardiology | Admitting: Cardiology

## 2024-12-11 DIAGNOSIS — R911 Solitary pulmonary nodule: Secondary | ICD-10-CM | POA: Diagnosis present

## 2024-12-15 ENCOUNTER — Ambulatory Visit: Payer: Self-pay | Admitting: Cardiology

## 2024-12-15 DIAGNOSIS — R911 Solitary pulmonary nodule: Secondary | ICD-10-CM

## 2024-12-25 ENCOUNTER — Ambulatory Visit: Admitting: Emergency Medicine

## 2024-12-25 ENCOUNTER — Encounter: Payer: Self-pay | Admitting: Emergency Medicine

## 2024-12-25 VITALS — BP 145/66 | HR 69 | Temp 97.9°F | Ht 69.0 in | Wt 166.0 lb

## 2024-12-25 DIAGNOSIS — R918 Other nonspecific abnormal finding of lung field: Secondary | ICD-10-CM

## 2024-12-25 NOTE — Patient Instructions (Signed)
" °  VISIT SUMMARY: During your visit, we discussed the pulmonary nodules found in your chest imaging. The primary nodule has slightly increased in size, and new nodules have appeared. Given your history of cancer, we need to rule out malignancy. We have planned further tests and follow-ups to monitor these nodules closely.  YOUR PLAN: -PULMONARY NODULES UNDER SURVEILLANCE: Pulmonary nodules are small growths in the lungs that can be benign (non-cancerous) or malignant (cancerous). Your primary nodule has grown slightly, and new nodules have appeared. We will perform a PET scan to get more information about the 10 mm nodule.  Depending on that scan we will decide next steps including possible repeat CT scan of the chest.  If the PET scan suggests malignancy, we may consider a bronchoscopy to examine the nodules more closely.  INSTRUCTIONS: Please schedule the PET scan as soon as possible.  Follow-up next available after the scan so we can review the results and discuss next steps in evaluation   "

## 2024-12-25 NOTE — Progress Notes (Unsigned)
 "  Subjective:    Patient ID: Dominic Butler, male    DOB: 12-16-1940, 84 y.o.   MRN: [id number]  HPI Discussed the use of AI scribe software for clinical note transcription with the patient, who gave verbal consent to proceed.   History of Present Illness Dominic Butler is an 84 year old male who presents with a pulmonary nodule noted on chest imaging. He is accompanied by his daughter. He was referred for evaluation of a pulmonary nodule.  A cardiac calcium  scoring CT chest done on January 04, 2023, revealed an 8 mm right lower pulmonary nodule. A follow-up CT on September 27, 2023, showed the nodule was unchanged. However, a CT on December 11, 2024, was performed, and the radiology report noted the nodule measured 10 mm. Additionally, new nodules were noted: a 7 mm posterior left upper lobe nodule, a 6 mm left lower lobe nodule, and a cluster of nodularity in the posterior left lower lobe.  He has a history of prostate cancer treated with radioactive seeds in 2004, with stable PSA levels since. He also has chronic lymphocytic leukemia diagnosed approximately three years ago, which is being monitored without intervention. 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 He underwent immunotherapy with BCG and has had two rounds of tumor resection due to recurrence, but the cancer has not invaded the muscle.  His past medical history includes hypertension, mild aortic stenosis, childhood asthma, and allergic rhinitis. He smokes a pipe daily and has a history of tobacco use. He reports no significant breathing issues currently, although he experiences nasal congestion and drainage when outdoors.  Family history is significant for liver cancer in his father, lung and breast cancer in his sister, and COPD in the family. 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    Results Radiology CT chest (cardiac calcium  scoring) (01/04/2023): 8 mm right lower lobe pulmonary nodule, 3 mm right lower lobe nodule, 3 mm pleural-based left lower lobe nodule. (Independently interpreted) CT chest (09/27/2023): 8 mm right lower lobe nodule smoothly marginated and unchanged compared to 01/04/2023; unchanged 3 mm right lower lobe nodule; unchanged 3 mm pleural-based left lower lobe nodule. (Independently interpreted) CT chest (12/11/2024): No mediastinal or hilar lymphadenopathy. 10 mm right lower lobe nodule (previously 8 mm), adjacent 3 mm right lower lobe nodule not visualized, new 7 mm posterior left upper lobe nodule, new 6 mm left lower lobe nodule, new cluster of nodularity in posterior left lower lobe. Some previously seen nodules resolved. 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, had seen urology and done all the studies, no known cause   Bradycardia    Cancer (HCC)    Prostate cancer status post radiation seeds   Elevated prostate specific antigen (PSA)    has had seed implants and also seen by [facility]   History of kidney stones    Hypertension    Mild aortic stenosis    Neuromuscular disorder (HCC)     Family History  Problem Relation Age of Onset   Heart attack Mother      Social History   Socioeconomic History   Marital status: Divorced    Spouse  name: Not on file   Number of children: Not on file   Years of education: Not on file   Highest education level: Not on file  Occupational History   Not on file  Tobacco Use   Smoking status: Every Day    Types: Pipe   Smokeless tobacco: Never   Tobacco comments:    States he smokes a pipe once a day as of 12/25/24  Vaping Use   Vaping status: Never Used  Substance and Sexual Activity   Alcohol  use: No   Drug use: No   Sexual  activity: Not on file  Other Topics Concern   Not on file  Social History Narrative   Not on file   Social Drivers of Health   Tobacco Use: High Risk (12/25/2024)   Patient History    Smoking Tobacco Use: Every Day    Smokeless Tobacco Use: Never    Passive Exposure: Not on file  Financial Resource Strain: Not on file  Food Insecurity: Not on file  Transportation Needs: Not on file  Physical Activity: Not on file  Stress: Not on file  Social Connections: Unknown (03/20/2022)   Received from [facility]   Social Network    Social Network: Not on file  Intimate Partner Violence: Unknown (02/25/2022)   Received from [facility]   HITS    Physically Hurt: Not on file    Insult or Talk Down To: Not on file    Threaten Physical Harm: Not on file    Scream or Curse: Not on file  Depression (PHQ2-9): Low Risk (06/17/2024)   Depression (PHQ2-9)    PHQ-2 Score: 0  Alcohol  Screen: Not on file  Housing: Not on file  Utilities: Not on file  Health Literacy: Not on file    Allergies[1]  Medications Ordered Prior to Encounter[2]     Objective:    Vitals:   12/25/24 1347  BP: (!) 174/74  Pulse: 69  Temp: 97.9 F (36.6 C)  SpO2: 95%  Weight: 166 lb (75.3 kg)  Height: 5' 9 (1.753 m)   Physical Exam Gen: Pleasant, well-nourished, in no distress,  normal affect  ENT: No lesions,  mouth clear,  oropharynx clear, no postnasal drip  Neck: No JVD, no stridor  Lungs: No use of accessory muscles, no crackles or wheezing on normal respiration, no wheeze on forced expiration  Cardiovascular: RRR, heart sounds normal, no murmur or gallops, no peripheral edema  Musculoskeletal: No deformities, no cyanosis or clubbing  Neuro: alert, awake, non focal  Skin: Warm, no lesions or rash       Assessment & Plan:   Assessment & Plan Pulmonary nodules    Assessment & Plan Pulmonary nodules under surveillance Multiple nodules identified. Primary nodule increased from 8 mm to  10 mm, minimal change. New nodules in left upper and lower lobes. Some nodules resolved, suggesting transient inflammation. Differential includes benign, inflammatory, malignancy. History of prostate, CLL and bladder cancer necessitates malignancy exclusion. Slow growth and resolution reassuring. - Ordered PET scan for 10 mm nodule.  Explained that the other nodules are below the detection threshold of PET. - Based on the PET we will decide next steps, either surveillance CT scan imaging, possibly bronchoscopy. - Consider bronchoscopy if PET scan suggests malignancy.   No follow-ups on file.  I personally spent a total of 62 minutes in the care of the patient today including preparing to see the patient, getting/reviewing separately obtained history, performing a medically appropriate exam/evaluation,  counseling and educating, placing orders, documenting clinical information in the EHR, independently interpreting results, and communicating results.    Lamar Chris, MD, PhD 12/25/2024, 2:27 PM [facility] [phone] or if no answer before 7:00PM call [phone] For any issues after 7:00PM please call eLink [phone]     [1] No Known Allergies [2]  Current Outpatient Medications on File Prior to Visit  Medication Sig Dispense Refill   amLODipine  (NORVASC ) 10 MG tablet Take 1 tablet by mouth once daily 90 tablet 0   atorvastatin  (LIPITOR) 20 MG tablet Take 1 tablet by mouth once daily 90 tablet 0   ibuprofen (ADVIL) 200 MG tablet Take 200 mg by mouth every 8 (eight) hours as needed for mild pain (pain score 1-3) or moderate pain (pain score 4-6). (Patient not taking: Reported on 12/25/2024)     traMADol  (ULTRAM ) 50 MG tablet Take 1 tablet (50 mg total) by mouth every 6 (six) hours as needed. (Patient not taking: Reported on 06/17/2024) 20 tablet 0   traMADol  (ULTRAM ) 50 MG tablet Take 1 tablet (50 mg total) by mouth every 6 (six) hours as needed. (Patient not  taking: Reported on 12/25/2024) 10 tablet 0   No current facility-administered medications on file prior to visit.   "

## 2025-01-08 ENCOUNTER — Other Ambulatory Visit (HOSPITAL_COMMUNITY)

## 2025-01-15 ENCOUNTER — Ambulatory Visit: Admitting: Emergency Medicine

## 2025-01-24 ENCOUNTER — Ambulatory Visit: Admitting: Cardiology

## 2025-06-17 ENCOUNTER — Ambulatory Visit: Admitting: Hematology

## 2025-06-17 ENCOUNTER — Other Ambulatory Visit
